# Patient Record
Sex: Female | Born: 1995 | Race: Black or African American | Hispanic: No | Marital: Single
Health system: Midwestern US, Community
[De-identification: ages and names within clinical notes are randomized; demographics above are authoritative.]

## PROBLEM LIST (undated history)

## (undated) DIAGNOSIS — Z3009 Encounter for other general counseling and advice on contraception: Secondary | ICD-10-CM

---
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
## 2013-11-01 MED ORDER — NORGESTIMATE-ETHINYL ESTRADIOL 0.18/0.215/0.25 MG-35 MCG(28) TAB
PACK | Freq: Every day | ORAL | Status: DC
Start: 2013-11-01 — End: 2014-02-21

## 2013-11-01 NOTE — Telephone Encounter (Signed)
Requested Prescriptions     Pending Prescriptions Disp Refills   ??? norgestimate-ethinyl estradiol (ORTHO TRI-CYCLEN, TRI-SPRINTEC) 0.18/0.215/0.25 mg-35 mcg (28) tablet 3 Package 3     Sig: Take 1 tablet by mouth daily. Generic Please     Patient is away at school and needs script called in to [facility] [phone] [place] [address]. Please call when ready. [phone]

## 2013-11-01 NOTE — Telephone Encounter (Signed)
Needs visit for annual physical

## 2013-11-02 NOTE — Telephone Encounter (Signed)
Called patient to inform her that refill for Ortho Tri Cyclen has been approved and the need to schedule a yearly physical exam.  Unable to leave a message. Phone number on file is invalid. Letter sent

## 2014-02-21 LAB — AMB POC URINE PREGNANCY TEST, VISUAL COLOR COMPARISON: HCG urine, Ql. (POC): NEGATIVE

## 2014-02-21 LAB — AMB POC HEMOGLOBIN (HGB): Hemoglobin (POC): 12.7

## 2014-02-21 MED ORDER — NORGESTIMATE-ETHINYL ESTRADIOL 0.18/0.215/0.25 MG-35 MCG(28) TAB
PACK | Freq: Every day | ORAL | Status: DC
Start: 2014-02-21 — End: 2014-02-21

## 2014-02-21 MED ORDER — NORGESTIMATE-ETHINYL ESTRADIOL 0.18/0.215/0.25 MG-35 MCG(28) TAB
PACK | Freq: Every day | ORAL | Status: DC
Start: 2014-02-21 — End: 2015-02-05

## 2014-02-21 NOTE — Progress Notes (Signed)
Patient is currently not taking the following medications:  n/a    Learning Assessment (baseline): Completed  Depression Screening: Completed  Fall Risk Screening: n/a  Abuse screening: n/a  ADL Assessment: n/a    1. Have you been to the ER, urgent care clinic since your last visit?  Hospitalized since your last visit?No    2. Have you seen or consulted any other health care providers outside of the  Health System since your last visit?  Include any pap smears or colon screening. No

## 2014-02-21 NOTE — Patient Instructions (Addendum)
Take over the counter ferrous sulfate 325 mg two times daily.  Anemia: After Your Visit  Your Care Instructions     Anemia is a low level of red blood cells, which carry oxygen throughout your body. Many things can cause anemia. Lack of iron is one of the most common causes. Your body needs iron to make hemoglobin, a substance in red blood cells that carries oxygen from the lungs to your body's cells. Without enough iron, the body produces fewer and smaller red blood cells. As a result, your body's cells do not get enough oxygen, and you feel tired and weak. And you may have trouble concentrating.  Bleeding is the most common cause of a lack of iron. You may have heavy menstrual bleeding or bleeding caused by conditions such as ulcers, hemorrhoids, or cancer. Regular use of aspirin or other anti-inflammatory medicines (such as ibuprofen) also can cause bleeding in some people. A lack of iron in your diet also can cause anemia, especially at times when the body needs more iron, such as during pregnancy, infancy, and the teen years.  Your doctor may have prescribed iron pills. It may take several months of treatment for your iron levels to return to normal. Your doctor also may suggest that you eat foods that are rich in iron, such as meat and beans.  There are many other causes of anemia. It is not always due to a lack of iron. Finding the specific cause of your anemia will help your doctor find the right treatment for you.  Follow-up care is a key part of your treatment and safety. Be sure to make and go to all appointments, and call your doctor if you are having problems. It's also a good idea to know your test results and keep a list of the medicines you take.  How can you care for yourself at home?  ?? Take your medicines exactly as prescribed. Call your doctor if you think you are having a problem with your medicine.  ?? If your doctor recommends iron pills, take them as directed:  ?? Try to take the pills on an  empty stomach about 1 hour before or 2 hours after meals. But you may need to take iron with food to avoid an upset stomach.  ?? Do not take antacids or drink milk or caffeine drinks (such as coffee, tea, or cola) at the same time or within 2 hours of the time that you take your iron. They can make it hard for your body to absorb the iron.  ?? Vitamin C (from food or supplements) helps your body absorb iron. Try taking iron pills with a glass of orange juice or some other food that is high in vitamin C, such as citrus fruits.  ?? Iron pills may cause stomach problems, such as heartburn, nausea, diarrhea, constipation, and cramps. Be sure to drink plenty of fluids, and include fruits, vegetables, and fiber in your diet each day. Iron pills often make your bowel movements dark or green.  ?? If you forget to take an iron pill, do not take a double dose of iron the next time you take a pill.  ?? Keep iron pills out of the reach of small children. An overdose of iron can be very dangerous.  ?? Follow your doctor's advice about eating iron-rich foods. These include red meat, shellfish, poultry, eggs, beans, raisins, whole-grain bread, and leafy green vegetables.  ?? Steam vegetables to help them keep their iron content.  When should you call for help?  Call 911 anytime you think you may need emergency care. For example, call if:  ?? You have symptoms of a heart attack. These may include:  ?? Chest pain or pressure, or a strange feeling in the chest.  ?? Sweating.  ?? Shortness of breath.  ?? Nausea or vomiting.  ?? Pain, pressure, or a strange feeling in the back, neck, jaw, or upper belly or in one or both shoulders or arms.  ?? Lightheadedness or sudden weakness.  ?? A fast or irregular heartbeat.  After you call 911, the operator may tell you to chew 1 adult-strength or 2 to 4 low-dose aspirin. Wait for an ambulance. Do not try to drive yourself.  ?? You passed out (lost consciousness).  Call your doctor now or seek immediate medical  care if:  ?? You have new or increased shortness of breath.  ?? You are dizzy or lightheaded, or you feel like you may faint.  ?? Your fatigue and weakness continue or get worse.  ?? You have any abnormal bleeding, such as:  ?? Nosebleeds.  ?? Vaginal bleeding that is different (heavier, more frequent, at a different time of the month) than what you are used to.  ?? Bloody or black stools, or rectal bleeding.  ?? Bloody or pink urine.  Watch closely for changes in your health, and be sure to contact your doctor if:  ?? You do not get better as expected.   Where can you learn more?   Go to [email or web address]  Enter R301 in the search box to learn more about "Anemia: After Your Visit."   ?? 2006-2014 [employer], [employer]. Care instructions adapted under license by [employer] (which disclaims liability or warranty for this information). This care instruction is for use with your licensed healthcare professional. If you have questions about a medical condition or this instruction, always ask your healthcare professional. [employer] disclaims any warranty or liability for your use of this information.  Content Version: 10.2.346038; Current as of: March 02, 2013

## 2014-02-21 NOTE — Progress Notes (Signed)
HISTORY OF PRESENT ILLNESS    Christine Graves is a 18 y.o. year old female here today to follow up for:  Contraception and fatigue    Patients symptoms have been present for several years.  Reports she's been told she's had anemia in the past and previously had been taking OTC iron supplement but discontinued.  States feels tired most of time described as lazy.  Denies palpations, bruising or cold intolerance.  Also requesting refill on oral contraceptives     No Known Allergies  Current Outpatient Prescriptions   Medication Sig Dispense Refill   ??? norgestimate-ethinyl estradiol (ORTHO TRI-CYCLEN, TRI-SPRINTEC) 0.18/0.215/0.25 mg-35 mcg (28) tablet Take 1 tablet by mouth daily. Generic Please  3 Package  3     Past Medical History   Diagnosis Date   ??? Asthma    ??? Acid reflux    ??? Eczema        ROS:  Review of Systems - General ROS: negative  Endocrine ROS: positive for - fatigue  Respiratory ROS: no cough, shortness of breath, or wheezing  Cardiovascular ROS: no chest pain or dyspnea on exertion      Objective:  BP 110/70    Pulse 73    Temp(Src) 98.2 ??F (36.8 ??C) (Oral)    Resp 16    Ht 5' 6.5" (1.689 m)    Wt 148 lb (67.132 kg)    BMI 23.53 kg/m2      SpO2 99%    LMP 01/25/2014     General appearance - alert, well appearing, and in no distress  Neck - supple, no significant adenopathy  Chest - clear to auscultation, no wheezes, rales or rhonchi, symmetric air entry  Heart - normal rate, regular rhythm, normal S1, S2, no murmurs, rubs, clicks or gallops  Abdomen: soft, non-tender. Bowel sounds normal. No masses,  no organomegaly    Results for orders placed in visit on 02/21/14   AMB POC HEMOGLOBIN (HGB)       Result Value Ref Range    Hemoglobin (POC) 12.7     AMB POC URINE PREGNANCY TEST, VISUAL COLOR COMPARISON       Result Value Ref Range    VALID INTERNAL CONTROL POC Yes      HCG urine, Ql. (POC) Negative  Negative       Assessment/Plan:   Christine Graves was seen today for contraception and fatigue.    Diagnoses and  associated orders for this visit:    Anemia  - AMB POC HEMOGLOBIN (HGB)    Family planning  - Discontinue: norgestimate-ethinyl estradiol (ORTHO TRI-CYCLEN, TRI-SPRINTEC) 0.18/0.215/0.25 mg-35 mcg (28) tablet; Take 1 Tab by mouth daily. Generic Please  - AMB POC URINE PREGNANCY TEST, VISUAL COLOR COMPARISON  - norgestimate-ethinyl estradiol (ORTHO TRI-CYCLEN, TRI-SPRINTEC) 0.18/0.215/0.25 mg-35 mcg (28) tablet; Take 1 Tab by mouth daily. Generic Please      take OTC iron 325 mg daily  I have discussed the diagnosis with the patient and the intended plan as seen in the above orders.  The patient has received an after-visit summary and questions were answered concerning future plans.  I have discussed medication side effects and warnings with the patient as well.  Pt verbalizes understanding.  Patient agreeable with above plan and verbalizes understanding.  Follow-up Disposition:  Return in about 1 year (around 02/22/2015) for WWE.

## 2015-02-01 NOTE — Telephone Encounter (Signed)
Pt called and needs a call back verifying the insurance company has sent request for auth on birth control to be routed to pt school. Pt is out of town in school and needs the authorization for meds approved and faxed back. Pt gave phone # [phone] to be reached.

## 2015-02-02 NOTE — Telephone Encounter (Signed)
Please advise

## 2015-02-05 ENCOUNTER — Encounter

## 2015-02-05 NOTE — Telephone Encounter (Signed)
Patient is out of states at school and needs this refilled as soon as possible. [phone]   Requested Prescriptions     Pending Prescriptions Disp Refills   ??? norgestimate-ethinyl estradiol (ORTHO TRI-CYCLEN, TRI-SPRINTEC) 0.18/0.215/0.25 mg-35 mcg (28) tab       Sig: Take 1 Tab by mouth daily. Generic Please

## 2015-02-06 MED ORDER — NORGESTIMATE-ETHINYL ESTRADIOL 0.18/0.215/0.25 MG-35 MCG(28) TAB
ORAL_TABLET | Freq: Every day | ORAL | Status: DC
Start: 2015-02-06 — End: 2015-02-08

## 2015-02-08 ENCOUNTER — Encounter

## 2015-02-08 MED ORDER — NORGESTIMATE-ETHINYL ESTRADIOL 0.18/0.215/0.25 MG-35 MCG(28) TAB
ORAL_TABLET | Freq: Every day | ORAL | Status: DC
Start: 2015-02-08 — End: 2021-07-11

## 2015-02-08 NOTE — Telephone Encounter (Signed)
Per request, medication needs to go to the mail order.   Medication refill verbal order per Clint LippsMargo Terrebonne Smith NP

## 2015-02-08 NOTE — Telephone Encounter (Signed)
Medication sent to the pharmacy on file

## 2015-02-08 NOTE — Telephone Encounter (Signed)
Medication filled on 02/06/15

## 2015-08-21 ENCOUNTER — Encounter (HOSPITAL_COMMUNITY): Payer: Self-pay | Admitting: *Deleted

## 2015-08-21 ENCOUNTER — Emergency Department (HOSPITAL_COMMUNITY)
Admission: EM | Admit: 2015-08-21 | Discharge: 2015-08-21 | Disposition: A | Discharge status: Home / Self Care | Payer: Managed Care, Other (non HMO) | Source: Home / Self Care | Attending: Emergency Medicine | Admitting: Emergency Medicine

## 2015-08-21 DIAGNOSIS — L270 Generalized skin eruption due to drugs and medicaments taken internally: Secondary | ICD-10-CM | POA: Diagnosis not present

## 2015-08-21 MED ORDER — PREDNISONE 50 MG PO TABS: 50.0000 mg | ORAL_TABLET | Freq: Every day | ORAL | Status: DC

## 2015-08-21 MED ORDER — HYDROXYZINE HCL 50 MG PO TABS: 50.0000 mg | ORAL_TABLET | Freq: Three times a day (TID) | ORAL | Status: AC | PRN

## 2015-08-21 NOTE — ED Notes (Signed)
Pt  Reports  Symptoms  Of a  Fine  Rash  That  Itches     With  The      Onset    About  3  Days   Ago   -  She  Reports  She  Was  On  Amoxicillin     Up  Until   About  6  Days  Ago    For  A  Sinus  Infection        she  Is in no  Acute  Distress  And  Is  Sitting  Upright on the  Exam table       In  No  Acute   Distress

## 2015-08-21 NOTE — Discharge Instructions (Signed)
Try Claritin or zyrtec, and the prednisone. If the Claritin and Zyrtec is not helping in the next 48 hours, stop this and start the Atarax. You may discontinue the hydrocortisone cream. Continue the desonide.

## 2015-08-21 NOTE — ED Provider Notes (Signed)
HPI  SUBJECTIVE:  Zoe Goonan is a 19 y.o. female who presents with a diffuse, pruritic rash over arms, back, torso, legs for the past 3 days. She states that she was taking amoxicillin for sinus infection several days prior to the rash showing up. No nausea, vomiting, fevers, lip swelling, difficulty breathing. No new lotions, soaps, detergents, contacts with similar rash. No sensation being bitten at night, no blood in the bedclothes in the morning. No pets in the house, no known exposure to poison ivy. Patient has past medical history of eczema. She otherwise has no medical problems.   History reviewed. No pertinent past medical history.  History reviewed. No pertinent past surgical history.  History reviewed. No pertinent family history.  Social History  Substance Use Topics  . Smoking status: Never Smoker   . Smokeless tobacco: None  . Alcohol Use: Yes    No current facility-administered medications for this encounter.  Current outpatient prescriptions:  .  Norgestim-Eth Estrad Triphasic (TRINESSA, 28, PO), Take by mouth., Disp: , Rfl:  .  hydrOXYzine (ATARAX/VISTARIL) 50 MG tablet, Take 1 tablet (50 mg total) by mouth 3 (three) times daily as needed., Disp: 30 tablet, Rfl: 0 .  predniSONE (DELTASONE) 50 MG tablet, Take 1 tablet (50 mg total) by mouth daily with breakfast., Disp: 5 tablet, Rfl: 0  No Known Allergies   ROS  As noted in HPI.   Physical Exam  BP 110/73 mmHg  Pulse 63  Temp(Src) 98 F (36.7 C) (Oral)  Resp 16  SpO2 100%  LMP 08/21/2015  Constitutional: Well developed, well nourished, no acute distress Eyes:  EOMI, conjunctiva normal bilaterally HENT: Normocephalic, atraumatic,mucus membranes moist Respiratory: Normal inspiratory effort Cardiovascular: Normal rate GI: nondistended skin: Diffuse fine non-erythematous papular rash over arms, back, diffuse fine papular erythematous rash over her abdomen. No excoriations, crusting, vesicles, burrows  between fingers. Musculoskeletal: no deformities Neurologic: Alert & oriented x 3, no focal neuro deficits Psychiatric: Speech and behavior appropriate   ED Course   Medications - No data to display  No orders of the defined types were placed in this encounter.    No results found for this or any previous visit (from the past 24 hour(s)). No results found.  ED Clinical Impression  Rash, drug   ED Assessment/Plan  Appears to be a drug reaction. There is no evidence of scabies, varicella, pityriasis rosea, contact dermatitis/poison ivy dermatitis, infection. We'll send home with oral steroids, Claritin or Zyrtec, if Claritin /Zyrtec does not work, patient to d/xc this and start Atarax. Follow-up here, student health, or her primary care physician if not getting better.   Discussed MDM, plan and followup with patient. Discussed sn/sx that should prompt return to the UC or ED. Patient agrees with plan.   *This clinic note was created using Dragon dictation software. Therefore, there may be occasional mistakes despite careful proofreading.  ?   Domenick Gong, MD 08/21/15 2047

## 2015-10-30 ENCOUNTER — Encounter (HOSPITAL_COMMUNITY): Payer: Self-pay | Admitting: Emergency Medicine

## 2015-10-30 ENCOUNTER — Emergency Department (HOSPITAL_COMMUNITY)
Admission: EM | Admit: 2015-10-30 | Discharge: 2015-10-30 | Disposition: A | Discharge status: Home / Self Care | Payer: Managed Care, Other (non HMO) | Source: Home / Self Care | Attending: Emergency Medicine | Admitting: Emergency Medicine

## 2015-10-30 DIAGNOSIS — N390 Urinary tract infection, site not specified: Secondary | ICD-10-CM

## 2015-10-30 LAB — POCT URINALYSIS DIP (DEVICE)
BILIRUBIN URINE: NEGATIVE
GLUCOSE, UA: NEGATIVE mg/dL
Ketones, ur: NEGATIVE mg/dL
NITRITE: NEGATIVE
Protein, ur: 100 mg/dL — AB
Specific Gravity, Urine: >=1.03 (ref 1.005–1.030)
UROBILINOGEN UA: 4 mg/dL — AB (ref 0.0–1.0)
pH: 7 (ref 5.0–8.0)

## 2015-10-30 LAB — POCT PREGNANCY, URINE: PREG TEST UR: NEGATIVE

## 2015-10-30 MED ORDER — NITROFURANTOIN MONOHYD MACRO 100 MG PO CAPS: 100.0000 mg | ORAL_CAPSULE | Freq: Two times a day (BID) | ORAL | Status: AC

## 2015-10-30 MED ORDER — PHENAZOPYRIDINE HCL 200 MG PO TABS: 200.0000 mg | ORAL_TABLET | Freq: Three times a day (TID) | ORAL | Status: AC | PRN

## 2015-10-30 NOTE — ED Provider Notes (Signed)
HPI  SUBJECTIVE:  Kaitlyn Caldwell is a 19 y.o. female who presents with dysuria, odorous urine for 5 days. She reports hematuria starting today. Denies clots. No aggravating or alleviating factors. She tried increasing her water intake. She admits to drinking lots of sodas recently. No nausea, vomiting, fevers, abdominal pain, back pain, urinary urgency, frequency. No vaginal bleeding, genital rash, vaginal itching, vaginal discharge, vaginal odor. No recent antibiotics. She is in a monogamous sexual relationship with a female who is asymptomatic. STDs are not a concern today. LMP 10/29. Past medical history negative for UTI, diabetes, hypertension, polynephritis, nephrolithiasis, gonorrhea, chlamydia, herpes, HIV, syphilis, BV, Trichomonas, yeast.     History reviewed. No pertinent past medical history.  History reviewed. No pertinent past surgical history.  No family history on file.  Social History  Substance Use Topics  . Smoking status: Never Smoker   . Smokeless tobacco: None  . Alcohol Use: Yes    No current facility-administered medications for this encounter.  Current outpatient prescriptions:  .  hydrOXYzine (ATARAX/VISTARIL) 50 MG tablet, Take 1 tablet (50 mg total) by mouth 3 (three) times daily as needed. (Patient not taking: Reported on 10/30/2015), Disp: 30 tablet, Rfl: 0 .  nitrofurantoin, macrocrystal-monohydrate, (MACROBID) 100 MG capsule, Take 1 capsule (100 mg total) by mouth 2 (two) times daily. X 5 days, Disp: 10 capsule, Rfl: 0 .  Norgestim-Eth Estrad Triphasic (TRINESSA, 28, PO), Take by mouth., Disp: , Rfl:  .  phenazopyridine (PYRIDIUM) 200 MG tablet, Take 1 tablet (200 mg total) by mouth 3 (three) times daily as needed for pain., Disp: 6 tablet, Rfl: 0  Allergies  Allergen Reactions  . Amoxicillin      ROS  As noted in HPI.   Physical Exam  BP 101/68 mmHg  Pulse 53  Temp(Src) 97.8 F (36.6 C) (Oral)  Resp 18  SpO2 100%  LMP  10/20/2015  Constitutional: Well developed, well nourished, no acute distress Eyes:  EOMI, conjunctiva normal bilaterally HENT: Normocephalic, atraumatic,mucus membranes moist Respiratory: Normal inspiratory effort Cardiovascular: Normal rate GI: Soft, mild suprapubic tenderness no flank tenderness normal appearance nondistended no guarding, rebound Back: No CVAT GU deferred skin: No rash, skin intact Musculoskeletal: no deformities Neurologic: Alert & oriented x 3, no focal neuro deficits Psychiatric: Speech and behavior appropriate   ED Course   Medications - No data to display  Orders Placed This Encounter  Procedures  . POCT urinalysis dip (device)    Standing Status: Standing     Number of Occurrences: 1     Standing Expiration Date:   . Pregnancy, urine POC    Standing Status: Standing     Number of Occurrences: 1     Standing Expiration Date:     Results for orders placed or performed during the hospital encounter of 10/30/15 (from the past 24 hour(s))  POCT urinalysis dip (device)     Status: Abnormal   Collection Time: 10/30/15  6:27 PM  Result Value Ref Range   Glucose, UA NEGATIVE NEGATIVE mg/dL   Bilirubin Urine NEGATIVE NEGATIVE   Ketones, ur NEGATIVE NEGATIVE mg/dL   Specific Gravity, Urine >=1.030 1.005 - 1.030   Hgb urine dipstick LARGE (A) NEGATIVE   pH 7.0 5.0 - 8.0   Protein, ur 100 (A) NEGATIVE mg/dL   Urobilinogen, UA 4.0 (H) 0.0 - 1.0 mg/dL   Nitrite NEGATIVE NEGATIVE   Leukocytes, UA SMALL (A) NEGATIVE  Pregnancy, urine POC     Status: None   Collection Time: 10/30/15  6:32 PM  Result Value Ref Range   Preg Test, Ur NEGATIVE NEGATIVE   No results found.  ED Clinical Impression  UTI (lower urinary tract infection)   ED Assessment/Plan  Reviewed labs, imaging independently. Labs, imaging normal. See radiology report for full details.  Discussed labs, imaging, MDM, plan and followup with patient.. Discussed sn/sx that should prompt  return to the UC or ED. Patient  agrees with plan.  *This clinic note was created using Dragon dictation software. Therefore, there may be occasional mistakes despite careful proofreading.  ?    Domenick GongAshley Ellamay Fors, MD 10/30/15 1911

## 2015-10-30 NOTE — ED Notes (Signed)
Complains of symptoms of uti.  Onset Thursday November 3 of symptoms.  Complains of pain with urination, odor, blood in urine

## 2015-10-30 NOTE — Discharge Instructions (Signed)
Make sure you drink more fluids. Your urine is very concentrated. You need to stop drinking the soda and drink at least 2 L of non-sugary non-caffeinated beverages per day.

## 2015-11-11 ENCOUNTER — Emergency Department (HOSPITAL_COMMUNITY)
Admission: EM | Admit: 2015-11-11 | Discharge: 2015-11-11 | Disposition: A | Discharge status: Home / Self Care | Payer: Managed Care, Other (non HMO) | Attending: Emergency Medicine | Admitting: Emergency Medicine

## 2015-11-11 ENCOUNTER — Encounter (HOSPITAL_COMMUNITY): Payer: Self-pay | Admitting: *Deleted

## 2015-11-11 DIAGNOSIS — Z880 Allergy status to penicillin: Secondary | ICD-10-CM | POA: Diagnosis not present

## 2015-11-11 DIAGNOSIS — Z79899 Other long term (current) drug therapy: Secondary | ICD-10-CM | POA: Diagnosis not present

## 2015-11-11 DIAGNOSIS — B351 Tinea unguium: Secondary | ICD-10-CM

## 2015-11-11 DIAGNOSIS — M79674 Pain in right toe(s): Secondary | ICD-10-CM | POA: Diagnosis present

## 2015-11-11 LAB — CBG MONITORING, ED: Glucose-Capillary: 115 mg/dL — ABNORMAL HIGH (ref 65–99)

## 2015-11-11 NOTE — ED Notes (Signed)
Pt states that she noted a black area to her rt great toe nail approx 1 month ago; pt states that approx 2 weeks ago the toe nail cracked; pt states that the toe is sore at times; no redness or drainage noted from toe; pt states that her mother is concerned due to a family hx of diabetes; pt has no hx of diabetes

## 2015-11-11 NOTE — ED Provider Notes (Signed)
CSN: [id number]     Arrival date & time 11/11/15  2020 History  By signing my name below, I, Ronney Lion, attest that this documentation has been prepared under the direction and in the presence of Earley Favor, NP. Electronically Signed: Ronney Lion, ED Scribe. 11/11/2015. 9:36 PM.    Chief Complaint  Patient presents with  . Toe Pain   The history is provided by the patient. No language interpreter was used.    HPI Comments: Manjot Beumer is a 19 y.o. female who presents to the Emergency Department complaining of constant, gradually worsening, right great toe discoloration and thickening that began  months ago. She states she first noticed a black area to her toe at that time,  the nail had cracked. Patient states her mother was concerned and pushed her to get medically evaluated because patient has a family history of DM. owever, patient denies a personal history of DM.   History reviewed. No pertinent past medical history. History reviewed. No pertinent past surgical history. No family history on file. Social History  Substance Use Topics  . Smoking status: Never Smoker   . Smokeless tobacco: None  . Alcohol Use: Yes     Comment: socially   OB History    No data available     Review of Systems  Constitutional: Negative for fever.  Musculoskeletal: Negative for joint swelling.  Skin: Positive for color change (to right great toe). Negative for rash and wound.  Neurological: Negative for numbness.  All other systems reviewed and are negative.  Allergies  Amoxicillin  Home Medications   Prior to Admission medications   Medication Sig Start Date End Date Taking? Authorizing Provider  hydrOXYzine (ATARAX/VISTARIL) 50 MG tablet Take 1 tablet (50 mg total) by mouth 3 (three) times daily as needed. Patient not taking: Reported on 10/30/2015 08/21/15   Domenick Gong, MD  nitrofurantoin, macrocrystal-monohydrate, (MACROBID) 100 MG capsule Take 1 capsule (100 mg total) by mouth 2  (two) times daily. X 5 days 10/30/15   Domenick Gong, MD  Norgestim-Eth Charlott Holler Triphasic (TRINESSA, 28, PO) Take by mouth.    Historical Provider, MD  phenazopyridine (PYRIDIUM) 200 MG tablet Take 1 tablet (200 mg total) by mouth 3 (three) times daily as needed for pain. 10/30/15   Domenick Gong, MD   BP 103/65 mmHg  Pulse 65  Temp(Src) 97.7 F (36.5 C) (Oral)  Resp 18  SpO2 100%  LMP 11/09/2015 Physical Exam  Constitutional: She is oriented to person, place, and time. She appears well-developed and well-nourished. No distress.  HENT:  Head: Normocephalic and atraumatic.  Eyes: Conjunctivae and EOM are normal.  Neck: Neck supple. No tracheal deviation present.  Cardiovascular: Normal rate.   Pulmonary/Chest: Effort normal. No respiratory distress.  Musculoskeletal: Normal range of motion. She exhibits no edema or tenderness.  Nail fungus of R great toe nail involving 1/2 nail bed  Neurological: She is alert and oriented to person, place, and time.  Skin: Skin is warm and dry.  Psychiatric: She has a normal mood and affect. Her behavior is normal.  Nursing note and vitals reviewed.   ED Course  Procedures (including critical care time)  DIAGNOSTIC STUDIES: Oxygen Saturation is 100% on RA, normal by my interpretation.    COORDINATION OF CARE: 9:31 PM - Suspect onychomycosis. Discussed treatment plan with pt at bedside which includes f/u with podiatrist and white vinegar soaks once a day for 1 month. Pt verbalized understanding and agreed to plan.   Labs Review  Labs Reviewed  CBG MONITORING, ED - Abnormal; Notable for the following:    Glucose-Capillary 115 (*)    All other components within normal limits   MDM   Final diagnoses:  None   I personally performed the services described in this documentation, which was scribed in my presence. The recorded information has been reviewed and is accurate.    Earley FavorGail Mayetta Castleman, NP 11/11/15 [id number]  Tilden FossaElizabeth Rees, MD 11/11/15  [id number]

## 2015-11-11 NOTE — Discharge Instructions (Signed)
Nail Ringworm A fungal infection of the nail (tinea unguium/onychomycosis) is common. It is common as the visible part of the nail is composed of dead cells which have no blood supply to help prevent infection. It occurs because fungi are everywhere and will pick any opportunity to grow on any dead material. Because nails are very slow growing they require up to 2 years of treatment with anti-fungal medications. The entire nail back to the base is infected. This includes approximately  of the nail which you cannot see. If your caregiver has prescribed a medication by mouth, take it every day and as directed. No progress will be seen for at least 6 to 9 months. Do not be disappointed! Because fungi live on dead cells with little or no exposure to blood supply, medication delivery to the infection is slow; thus the cure is slow. It is also why you can observe no progress in the first 6 months. The nail becoming cured is the base of the nail, as it has the blood supply. Topical medication such as creams and ointments are usually not effective. Important in successful treatment of nail fungus is closely following the medication regimen that your doctor prescribes. Sometimes you and your caregiver may elect to speed up this process by surgical removal of all the nails. Even this may still require 6 to 9 months of additional oral medications. See your caregiver as directed. Remember there will be no visible improvement for at least 6 months. See your caregiver sooner if other signs of infection (redness and swelling) develop.   This information is not intended to replace advice given to you by your health care provider. Make sure you discuss any questions you have with your health care provider.   Document Released: 12/05/2000 Document Revised: 04/24/2015 Document Reviewed: 06/11/2015 Elsevier Interactive Patient Education 2016 [employer] Call the Podiatrist for an appointment  You blood sugar is normal

## 2015-11-12 ENCOUNTER — Ambulatory Visit: Payer: Managed Care, Other (non HMO) | Admitting: Podiatry

## 2016-06-08 ENCOUNTER — Encounter (HOSPITAL_BASED_OUTPATIENT_CLINIC_OR_DEPARTMENT_OTHER): Payer: Self-pay | Admitting: Emergency Medicine

## 2016-06-08 ENCOUNTER — Emergency Department (HOSPITAL_BASED_OUTPATIENT_CLINIC_OR_DEPARTMENT_OTHER): Payer: Managed Care, Other (non HMO)

## 2016-06-08 ENCOUNTER — Emergency Department (HOSPITAL_BASED_OUTPATIENT_CLINIC_OR_DEPARTMENT_OTHER)
Admission: EM | Admit: 2016-06-08 | Discharge: 2016-06-08 | Disposition: A | Discharge status: Home / Self Care | Payer: Managed Care, Other (non HMO) | Attending: Emergency Medicine | Admitting: Emergency Medicine

## 2016-06-08 DIAGNOSIS — R1013 Epigastric pain: Secondary | ICD-10-CM | POA: Diagnosis present

## 2016-06-08 LAB — CBC WITH DIFFERENTIAL/PLATELET
BASOS ABS: 0 10*3/uL (ref 0.0–0.1)
BASOS PCT: 1 %
EOS ABS: 0.1 10*3/uL (ref 0.0–0.7)
Eosinophils Relative: 2 %
HCT: 33.6 % — ABNORMAL LOW (ref 36.0–46.0)
HEMOGLOBIN: 10.4 g/dL — AB (ref 12.0–15.0)
Lymphocytes Relative: 43 %
Lymphs Abs: 3 10*3/uL (ref 0.7–4.0)
MCH: 22.4 pg — ABNORMAL LOW (ref 26.0–34.0)
MCHC: 31 g/dL (ref 30.0–36.0)
MCV: 72.3 fL — ABNORMAL LOW (ref 78.0–100.0)
MONOS PCT: 13 %
Monocytes Absolute: 0.9 10*3/uL (ref 0.1–1.0)
NEUTROS PCT: 41 %
Neutro Abs: 2.8 10*3/uL (ref 1.7–7.7)
Platelets: 309 10*3/uL (ref 150–400)
RBC: 4.65 MIL/uL (ref 3.87–5.11)
RDW: 17.3 % — AB (ref 11.5–15.5)
WBC: 6.8 10*3/uL (ref 4.0–10.5)

## 2016-06-08 LAB — URINE MICROSCOPIC-ADD ON

## 2016-06-08 LAB — COMPREHENSIVE METABOLIC PANEL
ALBUMIN: 4 g/dL (ref 3.5–5.0)
ALK PHOS: 81 U/L (ref 38–126)
ALT: 12 U/L — ABNORMAL LOW (ref 14–54)
ANION GAP: 7 (ref 5–15)
AST: 18 U/L (ref 15–41)
BILIRUBIN TOTAL: 0.4 mg/dL (ref 0.3–1.2)
BUN: 10 mg/dL (ref 6–20)
CALCIUM: 8.7 mg/dL — AB (ref 8.9–10.3)
CO2: 24 mmol/L (ref 22–32)
Chloride: 105 mmol/L (ref 101–111)
Creatinine, Ser: 0.74 mg/dL (ref 0.44–1.00)
GLUCOSE: 109 mg/dL — AB (ref 65–99)
Potassium: 3.7 mmol/L (ref 3.5–5.1)
Sodium: 136 mmol/L (ref 135–145)
TOTAL PROTEIN: 7.3 g/dL (ref 6.5–8.1)

## 2016-06-08 LAB — URINALYSIS, ROUTINE W REFLEX MICROSCOPIC
Bilirubin Urine: NEGATIVE
GLUCOSE, UA: NEGATIVE mg/dL
KETONES UR: 15 mg/dL — AB
Nitrite: NEGATIVE
PH: 6 (ref 5.0–8.0)
Protein, ur: 100 mg/dL — AB
Specific Gravity, Urine: 1.021 (ref 1.005–1.030)

## 2016-06-08 LAB — PREGNANCY, URINE: Preg Test, Ur: NEGATIVE

## 2016-06-08 LAB — LIPASE, BLOOD: LIPASE: 22 U/L (ref 11–51)

## 2016-06-08 MED ORDER — SODIUM CHLORIDE 0.9 % IV SOLN
INTRAVENOUS | Status: DC
  Administered 2016-06-08: 21:00:00 via INTRAVENOUS

## 2016-06-08 MED ORDER — GI COCKTAIL ~~LOC~~
30.0000 mL | Freq: Once | ORAL | Status: AC
  Administered 2016-06-08: 30 mL via ORAL
  Filled 2016-06-08: qty 30

## 2016-06-08 MED ORDER — ONDANSETRON HCL 4 MG/2ML IJ SOLN
4.0000 mg | Freq: Once | INTRAMUSCULAR | Status: AC
  Administered 2016-06-08: 4 mg via INTRAVENOUS
  Filled 2016-06-08: qty 2

## 2016-06-08 MED ORDER — ONDANSETRON HCL 4 MG PO TABS: 4.0000 mg | ORAL_TABLET | Freq: Four times a day (QID) | ORAL | Status: AC

## 2016-06-08 MED ORDER — OMEPRAZOLE 20 MG PO CPDR: 20.0000 mg | DELAYED_RELEASE_CAPSULE | Freq: Every day | ORAL | Status: AC

## 2016-06-08 NOTE — ED Notes (Signed)
Pt given d/c instructions as per chart. Verbalizes understanding. No questions. Rx x 2. 

## 2016-06-08 NOTE — ED Notes (Addendum)
Pt alert, NAD, calm, interactive, resps e/u, no dyspnea noted, speech clear, ambulatory with steady gait from b[facility]r to room[facility]

## 2016-06-08 NOTE — ED Provider Notes (Signed)
CSN: [id number]     Arrival date & time 06/08/16  1939 History  By signing my name below, I, Marisue HumbleMichelle Chaffee, attest that this documentation has been prepared under the direction and in the presence of Linwood DibblesJon Leary Mcnulty, MD . Electronically Signed: Marisue HumbleMichelle Chaffee, Scribe. 06/08/2016. 8:46 PM.   Chief Complaint  Patient presents with  . Abdominal Pain    The history is provided by the patient. No language interpreter was used.   HPI Comments:  Kaitlyn Caldwell is a 20 y.o. female with no pertinent PMHx who presents to the Emergency Department complaining of intermittent sharp epigastric pain onset 1.5 weeks ago. Episodes of pain have been increasing in frequency. Pt reports associated nausea and decreased appetite. She notes pain was temporarily relieved after sleeping and was slightly worse after eating. Pt took Gas X without relief. Denies vomiting, cough, shortness of breath, or fever.   History reviewed. No pertinent past medical history. History reviewed. No pertinent past surgical history. History reviewed. No pertinent family history. Social History  Substance Use Topics  . Smoking status: Never Smoker   . Smokeless tobacco: None  . Alcohol Use: Yes     Comment: socially   OB History    No data available     Review of Systems  Constitutional: Positive for appetite change. Negative for fever.  Gastrointestinal: Positive for nausea and abdominal pain.  All other systems reviewed and are negative.   Allergies  Amoxicillin  Home Medications   Prior to Admission medications   Medication Sig Start Date End Date Taking? Authorizing Provider  hydrOXYzine (ATARAX/VISTARIL) 50 MG tablet Take 1 tablet (50 mg total) by mouth 3 (three) times daily as needed. Patient not taking: Reported on 10/30/2015 08/21/15   Domenick GongAshley Mortenson, MD  nitrofurantoin, macrocrystal-monohydrate, (MACROBID) 100 MG capsule Take 1 capsule (100 mg total) by mouth 2 (two) times daily. X 5 days 10/30/15   Domenick GongAshley  Mortenson, MD  Norgestim-Eth Charlott HollerEstrad Triphasic (TRINESSA, 28, PO) Take by mouth.    Historical Provider, MD  omeprazole (PRILOSEC) 20 MG capsule Take 1 capsule (20 mg total) by mouth daily. 06/08/16   Linwood DibblesJon Jayra Choyce, MD  ondansetron (ZOFRAN) 4 MG tablet Take 1 tablet (4 mg total) by mouth every 6 (six) hours. 06/08/16   Linwood DibblesJon Travia Onstad, MD  phenazopyridine (PYRIDIUM) 200 MG tablet Take 1 tablet (200 mg total) by mouth 3 (three) times daily as needed for pain. 10/30/15   Domenick GongAshley Mortenson, MD   BP 118/88 mmHg  Pulse 52  Temp(Src) 99.1 F (37.3 C) (Oral)  Resp 18  Ht 5\' 7"  (1.702 m)  Wt 65.772 kg  BMI 22.71 kg/m2  SpO2 100%  LMP 06/08/2016   Physical Exam  Constitutional: She appears well-developed and well-nourished. No distress.  HENT:  Head: Normocephalic and atraumatic.  Right Ear: External ear normal.  Left Ear: External ear normal.  Eyes: Conjunctivae are normal. Right eye exhibits no discharge. Left eye exhibits no discharge. No scleral icterus.  Neck: Neck supple. No tracheal deviation present.  Cardiovascular: Normal rate, regular rhythm and intact distal pulses.   Pulmonary/Chest: Effort normal and breath sounds normal. No stridor. No respiratory distress. She has no wheezes. She has no rales.  Abdominal: Soft. Bowel sounds are normal. She exhibits no distension. There is tenderness in the epigastric area. There is no rigidity, no rebound and no guarding.  Musculoskeletal: She exhibits no edema or tenderness.  Neurological: She is alert. She has normal strength. No cranial nerve deficit (no facial droop, extraocular  movements intact, no slurred speech) or sensory deficit. She exhibits normal muscle tone. She displays no seizure activity. Coordination normal.  Skin: Skin is warm and dry. No rash noted.  Psychiatric: She has a normal mood and affect.  Nursing note and vitals reviewed.   ED Course  Procedures  DIAGNOSTIC STUDIES:  Oxygen Saturation is 100% on RA, normal by my  interpretation.    COORDINATION OF CARE:  8:27 PM Will order blood work, UA and chest x-ray. Discussed treatment plan with pt at bedside and pt agreed to plan.  Labs Review Labs Reviewed  URINALYSIS, ROUTINE W REFLEX MICROSCOPIC (NOT AT [facility]) - Abnormal; Notable for the following:    Color, Urine RED (*)    APPearance TURBID (*)    Hgb urine dipstick LARGE (*)    Ketones, ur 15 (*)    Protein, ur 100 (*)    Leukocytes, UA SMALL (*)    All other components within normal limits  COMPREHENSIVE METABOLIC PANEL - Abnormal; Notable for the following:    Glucose, Bld 109 (*)    Calcium 8.7 (*)    ALT 12 (*)    All other components within normal limits  CBC WITH DIFFERENTIAL/PLATELET - Abnormal; Notable for the following:    Hemoglobin 10.4 (*)    HCT 33.6 (*)    MCV 72.3 (*)    MCH 22.4 (*)    RDW 17.3 (*)    All other components within normal limits  URINE MICROSCOPIC-ADD ON - Abnormal; Notable for the following:    Squamous Epithelial / LPF 0-5 (*)    Bacteria, UA RARE (*)    All other components within normal limits  PREGNANCY, URINE  LIPASE, BLOOD    Imaging Review Dg Chest 2 View  06/08/2016  CLINICAL DATA:  Sharp mid to upper abdominal pain worsening over the past week with nausea and some right lower back pain. EXAM: CHEST  2 VIEW COMPARISON:  None. FINDINGS: Lungs are adequately inflated without consolidation or effusion. Cardiomediastinal silhouette, bones and soft tissues are within normal. IMPRESSION: No active cardiopulmonary disease. Electronically Signed   By: Elberta Fortis M.D.   On: 06/08/2016 21:13   I have personally reviewed and evaluated these images and lab results as part of my medical decision-making.    MDM   Final diagnoses:  Epigastric pain   Patient was treated with a GI cocktail and nausea medication. Her laboratory tests and x-rays are unremarkable. She does have hematuria however she is currently on her menstrual period  Possible her symptoms  may be related to gastritis or peptic ulcer disease. Less likely biliary colic. I will start the patient on antacids. I recommend follow-up with a gastroenterologist. Warning signs and precautions discussed.   I personally performed the services described in this documentation, which was scribed in my presence.  The recorded information has been reviewed and is accurate.    Linwood Dibbles, MD 06/08/16 [phone]

## 2016-06-08 NOTE — ED Notes (Signed)
paitent states that she is having sharp epigastric pains x 1 week. Worse today

## 2016-06-08 NOTE — Discharge Instructions (Signed)

## 2016-06-08 NOTE — ED Notes (Signed)
C/o sharp mid upper abd pain, increasing in frequency over last week, also nausea, and some R lower back pain, LMP Thursday (continues), (denies: vd, constipation, fever, recent illness, or other sx), "thought it might be gas r/t to eating yogart, no relief with gas-x, aleve or laxative". Last BM Friday (normal), last ate 1600.

## 2017-05-17 IMAGING — CR DG CHEST 2V
2 series · 2 of 2 positions shown · non-contrast
Comparison: None.

CLINICAL DATA: Sharp mid to upper abdominal pain worsening over the
past week with nausea and some right lower back pain.

EXAM:
CHEST  2 VIEW

[w chest pa]
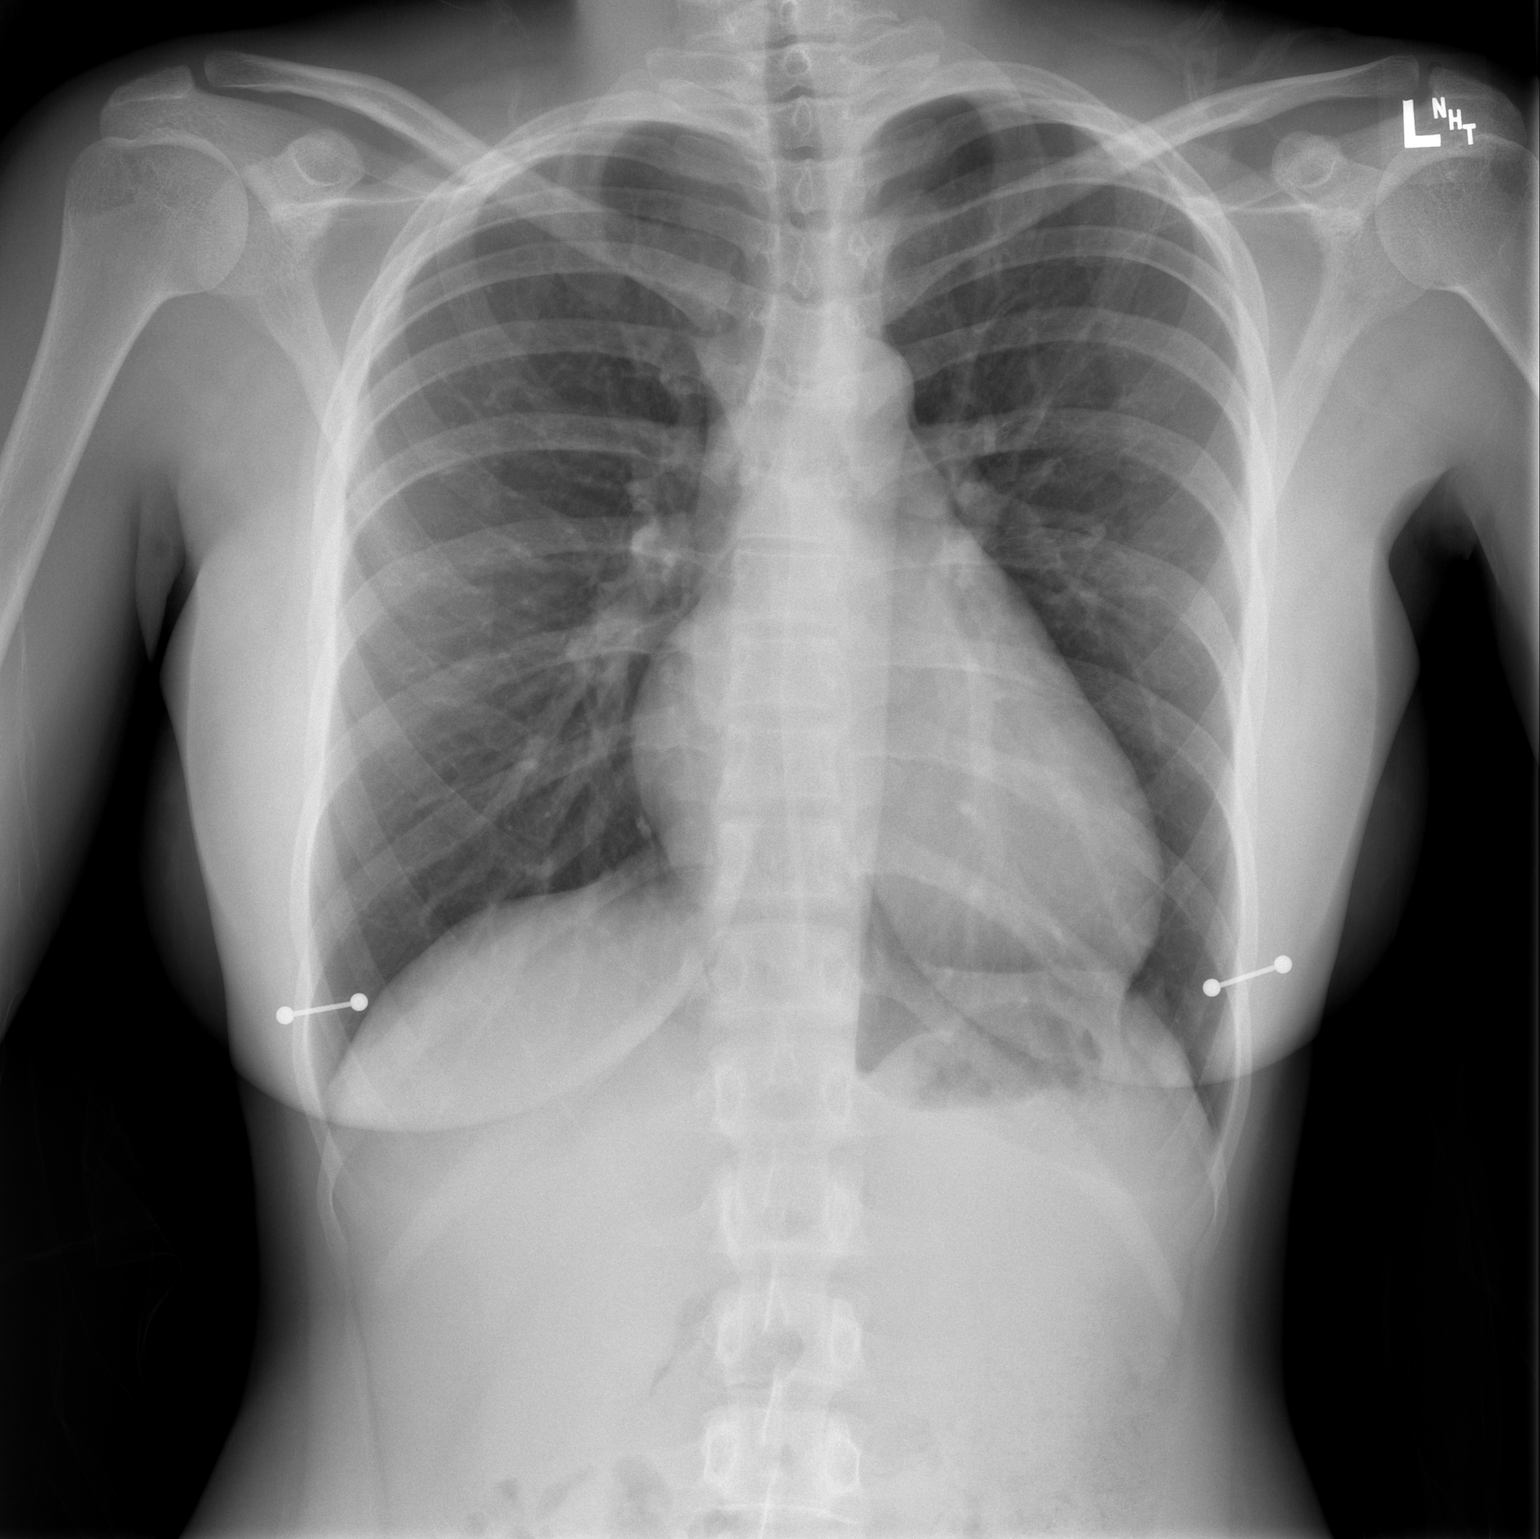

[w chest lat]
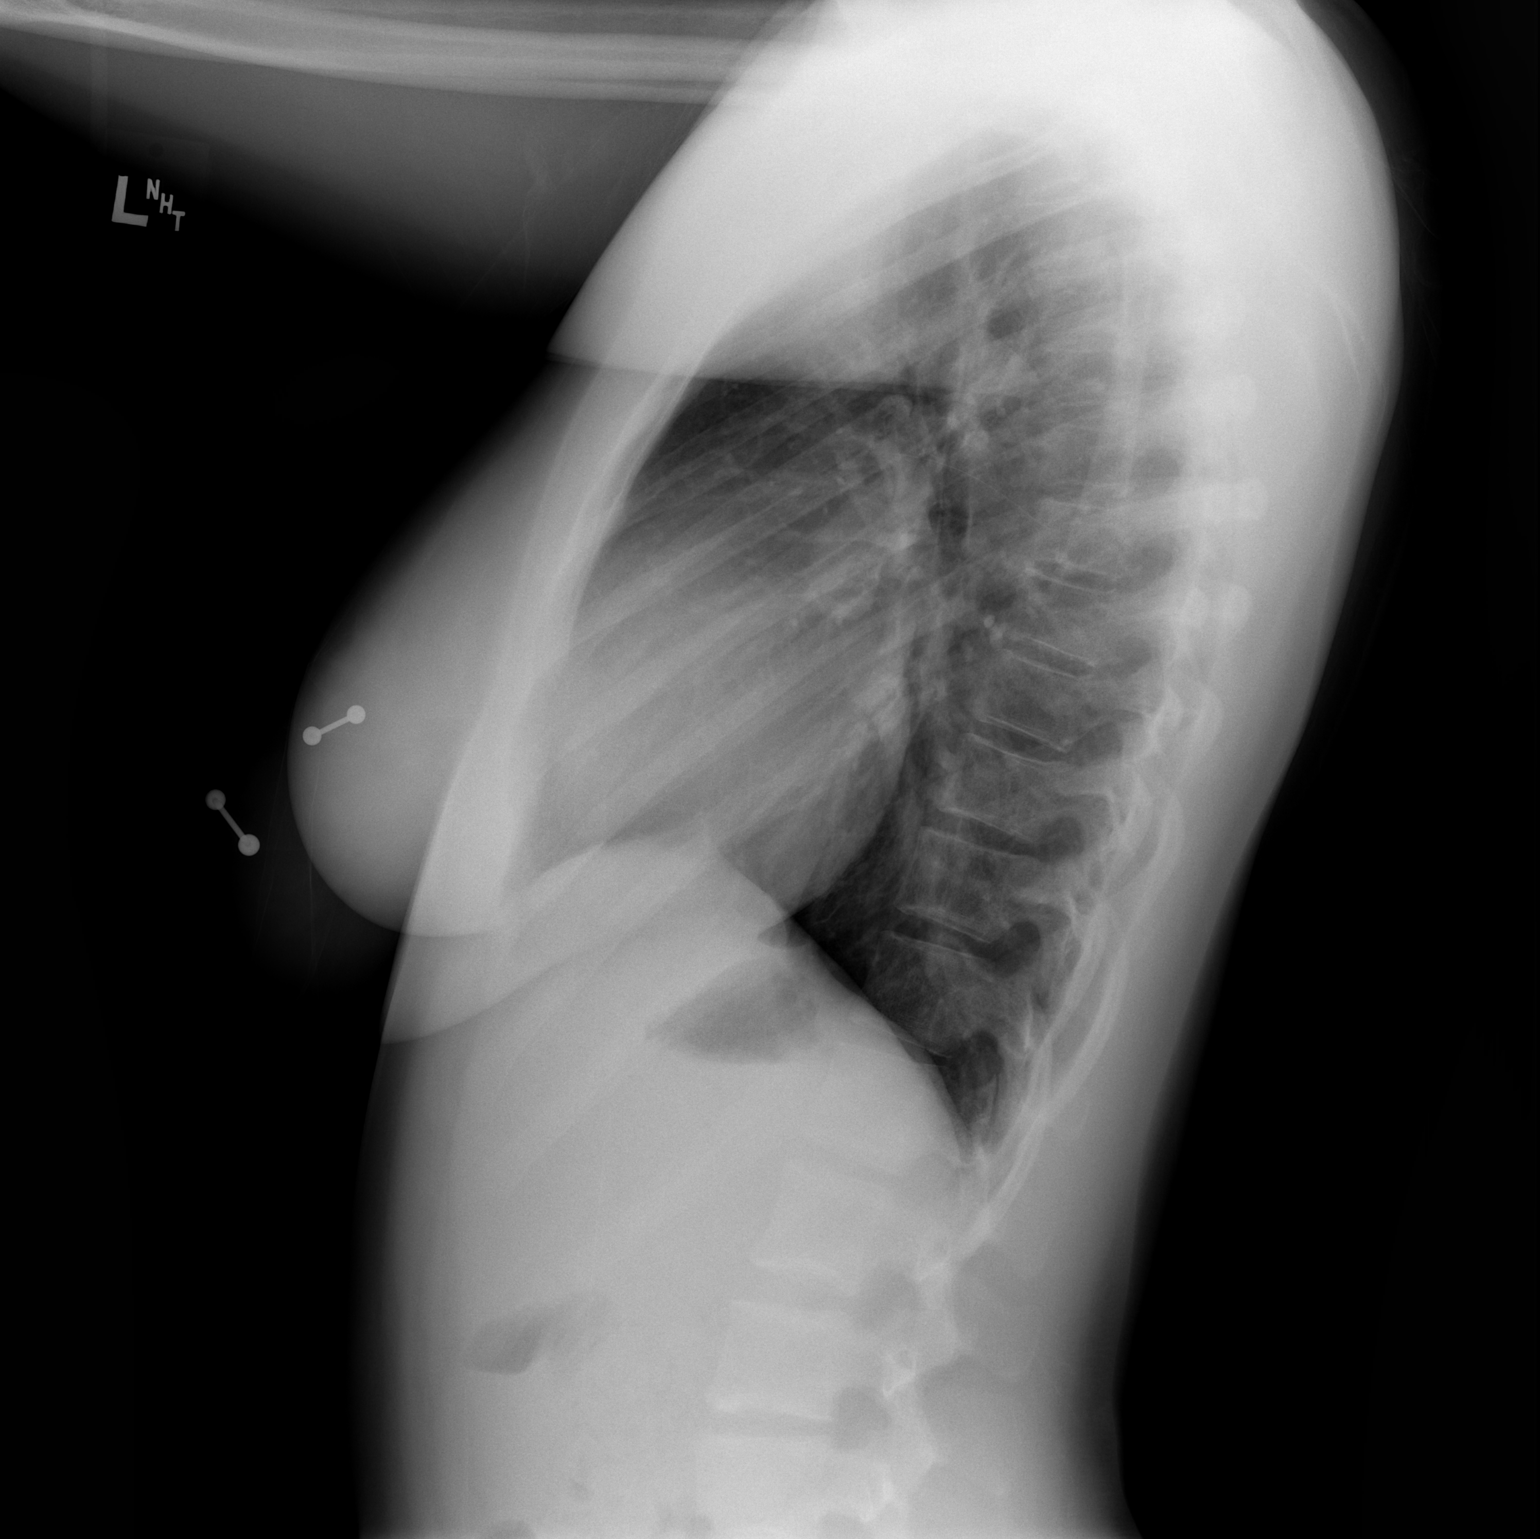

[2 of 2 positions shown; findings below may reference images not displayed]

FINDINGS: Lungs are adequately inflated without consolidation or effusion.
Cardiomediastinal silhouette, bones and soft tissues are within
normal.
IMPRESSION: No active cardiopulmonary disease.

## 2021-07-11 ENCOUNTER — Inpatient Hospital Stay
Admit: 2021-07-11 | Discharge: 2021-07-12 | Disposition: A | Discharge status: Home / Self Care | Payer: PRIVATE HEALTH INSURANCE | Attending: Emergency Medicine

## 2021-07-11 DIAGNOSIS — G44319 Acute post-traumatic headache, not intractable: Secondary | ICD-10-CM

## 2021-07-11 MED ORDER — NAPROXEN 250 MG TAB
250 mg | ORAL | Status: AC
Start: 2021-07-11 — End: 2021-07-11
  Administered 2021-07-11: via ORAL

## 2021-07-11 MED ORDER — NAPROXEN 375 MG TAB
375 mg | ORAL_TABLET | Freq: Two times a day (BID) | ORAL | 0 refills | Status: AC
Start: 2021-07-11 — End: ?

## 2021-07-11 MED ORDER — METOCLOPRAMIDE 10 MG TAB
10 mg | ORAL_TABLET | Freq: Four times a day (QID) | ORAL | 0 refills | Status: AC | PRN
Start: 2021-07-11 — End: 2021-07-21

## 2021-07-11 MED ORDER — METHOCARBAMOL 500 MG TAB
500 mg | ORAL | Status: DC
Start: 2021-07-11 — End: 2021-07-11

## 2021-07-11 MED ORDER — METHOCARBAMOL 500 MG TAB
500 mg | ORAL_TABLET | Freq: Four times a day (QID) | ORAL | 0 refills | Status: AC | PRN
Start: 2021-07-11 — End: ?

## 2021-07-11 MED ORDER — BUTALBITAL-ACETAMINOPHEN-CAFFEINE 50 MG-325 MG-40 MG TAB
50-325-40 mg | ORAL | Status: AC
Start: 2021-07-11 — End: 2021-07-11
  Administered 2021-07-11: via ORAL

## 2021-07-11 MED ORDER — BUTALBITAL-ACETAMINOPHEN-CAFFEINE 50 MG-325 MG-40 MG TAB
50-325-40 mg | ORAL_TABLET | Freq: Four times a day (QID) | ORAL | 0 refills | Status: AC | PRN
Start: 2021-07-11 — End: ?

## 2021-07-11 MED FILL — METHOCARBAMOL 500 MG TAB: 500 mg | ORAL | Qty: 1

## 2021-07-11 MED FILL — BUTALBITAL-ACETAMINOPHEN-CAFFEINE 50 MG-325 MG-40 MG TAB: 50-325-40 mg | ORAL | Qty: 1

## 2021-07-11 MED FILL — NAPROXEN 250 MG TAB: 250 mg | ORAL | Qty: 2
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
## 2021-07-11 NOTE — ED Notes (Signed)
7:59 PM  07/11/21     Discharge instructions given to patient (name) with verbalization of understanding. Patient accompanied by no one.  Patient discharged with the following prescriptions sent to pharmacy. Patient discharged to home (destination).      Renee Schanke

## 2023-07-13 LAB — RUBELLA TITER, EXTERNAL RESULT: Rubella Titer, External Result: IMMUNE

## 2023-07-13 LAB — RPR, EXTERNAL RESULT: RPR, External Result: NONREACTIVE

## 2023-07-13 LAB — HIV, EXTERNAL RESULT: HIV, External Result: NEGATIVE

## 2023-07-13 LAB — HEPATITIS B, EXTERNAL RESULT: Hep B, External Result: NEGATIVE

## 2023-12-23 NOTE — L&D Delivery Note (Signed)
Brief Postoperative Note    Patient: Christine Graves    MRN: [id number]    Date of Birth: 1996-10-20    Age: 28 y.o.    Admit Date: 01/21/2024    Attending Physician: Babette Relic, MD    Pre-Operative diagnosis: IUP 39.3 weeks, Polyhydramnios, Suspected Macrosomia, Breech presentation    Post-Operative Diagnosis: Same    Procedure: Procedure(s):  Primary LTCS    Findings: VFI with Apgars 8&9, Wt 4510g; Placenta with 3VC; Breech presentation    Surgeon: Babette Relic, MD    Assistants: B. Holder SA    Anesthesia Type: Spinal    Specimens Removed: Placenta    EBL: 800cc    Drains: Foley    Summary of Complications: None    Babette Relic, MD  January 21, 2024  9:18 PM        Markitta, [place] Girl Tanga [[id number]]      Labor Events    Preterm Labor: No  Antenatal Steroids: None  Cervical Ripening Date/Time:      Antibiotics Received during Labor: No  Rupture Date/Time:      Rupture Type: AROM  Fluid Color: Clear  Fluid Volume: Large       Anesthesia    Method: Spinal       Labor Length    3rd stage: 0h 46m       Delivery Details      Delivery Date: 01/21/24 Delivery Time: 20:25:00   Delivery Type: C-Section, Low Transverse  Trial of Labor?: No  C-Section Categorization: Primary  C-Section Priority: unscheduled  Indications for C-Section: Macrosomia, Breech       Skin Incision Type: Low Transverse  Uterine Incision: Low Transverse       Newborn Presentation    Presentation: Breech       Shoulder Dystocia    Shoulder Dystocia Present?: No       Assisted Delivery Details    Forceps Attempted?: No  Vacuum Extractor Attempted?: No                           Cord    Vessels: 3 Vessels  Complications: None  Delayed Cord Clamping?: Yes  Cord Clamped Date/Time: 01/21/2024 20:26:00  Cord Blood Disposition: Lab  Gases Sent?: No              Placenta    Date/Time: 01/21/2024 20:27:00  Removal: Spontaneous  Appearance: Intact  Disposition: Discarded       Lacerations    Episiotomy: None  Perineal Lacerations: None  Other Lacerations: no  non-perineal laceration       Blood Loss  Mother: Christine Graves, Christine Graves     Start of Mother's Information      Delivery Blood Loss   Intrapartum & Postpartum: 01/21/24 1951 - 01/21/24 2118    Delivery Admission: 01/21/24 1608 - 01/21/24 2118         Intrapartum & Postpartum Delivery Admission    Quantitative Blood Loss - Delivery (mL) Hospital Encounter [phone]    Total  948 mL 948 mL               End of Mother's Information  Mother: Christine Graves, Christine Graves                Delivery Providers    Delivering clinician: Babette Relic, MD     Provider Role    Babette Relic, MD Obstetrician    Daryel November, RN Primary Nurse  Alease Frame, RN Primary Newborn Nurse     NICU Nurse    Ryan-Smith, Moss Mc, MD Neonatologist     Anesthesiologist     Nurse Anesthetist     Nurse Practitioner     Midwife    Olene Craven, RN Nursery Nurse     Respiratory Therapist    Laural Benes, Patrice R Scrub Tech    Buck Mam Assistant Surgeon              Newborn Assessment    Living Status: Living        Skin Color:   Heart Rate:   Reflex Irritability:   Muscle Tone:   Respiratory Effort:   Total:            1 Minute:    0    2    2    2    2    8         5  Minute:    1    2    2    2    2    9                                         Apgars Assigned By: DR. RYAN SMITH              Resuscitation    Method: Bulb Suction, Room Air, Stimulation, Suctioning             Newborn Measurements      Birth Weight: 4510 g   Birth Length: 54.5 cm

## 2023-12-28 LAB — GBS, EXTERNAL RESULT: GBS, External Result: POSITIVE

## 2023-12-28 LAB — N. GONORRHOEAE, EXTERNAL RESULT: N. Gonorrhoeae, External Result: NEGATIVE

## 2023-12-28 LAB — RPR, EXTERNAL RESULT: RPR, External Result: NONREACTIVE

## 2023-12-28 LAB — C. TRACHOMATIS, EXTERNAL RESULT: C. Trachomatis, External Result: NEGATIVE

## 2024-01-21 ENCOUNTER — Inpatient Hospital Stay
Admit: 2024-01-21 | Discharge: 2024-01-23 | Disposition: A | Discharge status: Home / Self Care | Payer: BLUE CROSS/BLUE SHIELD | Attending: Obstetrics & Gynecology | Admitting: Obstetrics & Gynecology

## 2024-01-21 DIAGNOSIS — O409XX0 Polyhydramnios, unspecified trimester, not applicable or unspecified: Secondary | ICD-10-CM

## 2024-01-21 DIAGNOSIS — G8918 Other acute postprocedural pain: Secondary | ICD-10-CM

## 2024-01-21 DIAGNOSIS — O3663X0 Maternal care for excessive fetal growth, third trimester, not applicable or unspecified: Secondary | ICD-10-CM

## 2024-01-21 LAB — CBC
Hematocrit: 42.7 % (ref 35.0–47.0)
Hemoglobin: 14.2 g/dL (ref 11.0–16.0)
MCH: 27.5 pg (ref 25.4–34.6)
MCHC: 33.3 g/dL (ref 30.0–36.0)
MCV: 82.6 fL (ref 80.0–98.0)
MPV: 11.2 fL — ABNORMAL HIGH (ref 6.0–10.0)
Platelets: 203 10*3/uL (ref 140–450)
RBC: 5.17 M/uL (ref 3.60–5.20)
RDW: 49.1 — ABNORMAL HIGH (ref 36.4–46.3)
WBC: 11.1 10*3/uL — ABNORMAL HIGH (ref 4.0–11.0)

## 2024-01-21 LAB — BASIC METABOLIC PANEL
Anion Gap: 10 mmol/L (ref 5–15)
BUN: 7 mg/dL — ABNORMAL LOW (ref 9–23)
CO2: 20 meq/L (ref 20–31)
Calcium: 9.9 mg/dL (ref 8.7–10.4)
Chloride: 105 meq/L (ref 98–107)
Creatinine: 0.89 mg/dL (ref 0.55–1.02)
GFR African American: >60
GFR Non-African American: >60
Glucose: 81 mg/dL (ref 74–106)
Potassium: 3.7 meq/L (ref 3.5–5.1)
Sodium: 135 meq/L — ABNORMAL LOW (ref 136–145)

## 2024-01-21 LAB — ABO/RH: ABO/Rh: O NEG

## 2024-01-21 LAB — ANTIBODY SCREEN: Antibody Screen: POSITIVE

## 2024-01-21 MED ORDER — OXYTOCIN 30 UNITS IN 500 ML INFUSION
30500 | INTRAVENOUS | Status: AC | PRN
Start: 2024-01-21 — End: 2024-01-21
  Administered 2024-01-22: 01:00:00 10 [IU] via INTRAVENOUS

## 2024-01-21 MED ORDER — ONDANSETRON HCL 4 MG/2ML IJ SOLN
42 | Freq: Four times a day (QID) | INTRAMUSCULAR | Status: DC | PRN
Start: 2024-01-21 — End: 2024-01-22

## 2024-01-21 MED ORDER — OXYTOCIN 30 UNITS IN 500 ML INFUSION
30500 UNIT/500ML | INTRAVENOUS | Status: AC | PRN
Start: 2024-01-21 — End: 2024-01-23
  Administered 2024-01-22: 02:00:00 87.3 m[IU]/min via INTRAVENOUS

## 2024-01-21 MED ORDER — SODIUM CHLORIDE 0.9 % IV SOLN
0.9 | INTRAVENOUS | Status: DC | PRN
Start: 2024-01-21 — End: 2024-01-22

## 2024-01-21 MED ORDER — LACTATED RINGERS IV BOLUS
Freq: Once | INTRAVENOUS | Status: AC
Start: 2024-01-21 — End: 2024-01-21
  Administered 2024-01-21: 22:00:00 1000 mL via INTRAVENOUS

## 2024-01-21 MED ORDER — CEFAZOLIN SODIUM-DEXTROSE 2-4 GM/100ML-% IV SOLN
2-4100- | Freq: Once | INTRAVENOUS | Status: AC
Start: 2024-01-21 — End: 2024-01-21
  Administered 2024-01-22: 01:00:00 2000 mg via INTRAVENOUS

## 2024-01-21 MED ORDER — NORMAL SALINE FLUSH 0.9 % IV SOLN
0.9 | INTRAVENOUS | Status: DC | PRN
Start: 2024-01-21 — End: 2024-01-22

## 2024-01-21 MED ORDER — LACTATED RINGERS IV SOLN
INTRAVENOUS | Status: DC
Start: 2024-01-21 — End: 2024-01-22
  Administered 2024-01-22 (×2): via INTRAVENOUS

## 2024-01-21 MED FILL — LACTATED RINGERS IV SOLN: INTRAVENOUS | Qty: 1000

## 2024-01-21 MED FILL — CEFAZOLIN SODIUM-DEXTROSE 2-4 GM/100ML-% IV SOLN: 2-4100- GM/100ML-% | INTRAVENOUS | Qty: 100

## 2024-01-21 NOTE — Progress Notes (Addendum)
1930: SBAR shift report received from [employer]. Care assumed    1951: In OR 1  2025: Viable infant girl delivered, APGARS 8/9  2124: Out of OR  2125: In PACU   2330: Out of PACU-Patient transferred to [facility] 4118 in stable condition. SBAR report given to [employer]. Care transferred

## 2024-01-21 NOTE — Op Note (Signed)
 [facility]  Operation Report  NAME:  Christine Graves, Christine Graves  SEX:   F  DATE: 01/21/2024  DOB: 01-23-1996  MR#    [id number]  ROOM:  RR1  ACCT#  [id number]    cc: Babette Relic MD      PREOPERATIVE DIAGNOSES:   Intrauterine pregnancy at 33 and 3/7 weeks, polyhydramnios, suspected macrosomia and breech presentation.     POSTOPERATIVE DIAGNOSES:   Same.     PROCEDURE:   Primary low transverse cesarean section.     SURGEON:   Dr. Butch Penny.     ASSISTANT:   B. Holder, SA     ANESTHESIA:   Spinal.     ESTIMATED BLOOD LOSS:   800 mL.     DRAINS:   Foley catheter.     COMPLICATIONS:   None.     FINDINGS:   Viable female infant with Apgars 8 and 9 weight 4510 grams; placenta with 3-vessel cord; Frank breech presentation.     DESCRIPTION OF PROCEDURE:   The patient was taken to the operating room and given spinal anesthesia while in the upright position.  The patient was placed in the supine position with left lateral tilt and fetal heart tones documented.  A Foley catheter was placed.  The patient was prepped and sterilely draped.  A Pfannenstiel skin incision was made in lower abdomen and carried to the fascia with a sharp knife.  The fascia was incised.  The rectus muscles were bluntly divided.  The peritoneum was grasped, elevated and entered.  The peritoneum was extended cephalad to caudad.  The bladder blade was placed.  A bladder flap was developed from the overlying vesicouterine peritoneum and the bladder blade replaced.  A low transverse uterine incision was made which was extended bluntly.  The membranes were ruptured with clear fluid noted.  The infant was delivered from a frank breech presentation without difficulty.  Vigorous suctioning was carried out on the field as delayed cord clamping was performed for 30 seconds.  The cord was doubly clamped and cut.  The infant was handed off to the awaiting pediatrician.  The placenta was manually extracted.  The uterus was exteriorized and cleaned with a moist lap  tape.  The uterus was closed in two layers using 0 chromic suture.  A running locked stitch was used on the first layer followed by imbricating stitch on the second.  There was oozing at the incision line which was controlled with additional figure-of-eight stitches of 0 chromic suture.  The ovaries and tubes were inspected and found to be normal.  The uterus was placed back into the abdominal cavity and the gutters cleaned.  The peritoneum was closed with a running stitch of 2-0 chromic suture.  The fascia was closed with a running intermittently locked stitch of 0 Vicryl suture.  The subcutaneous tissue was irrigated with small bleeders controlled with cautery.  The subcutaneous tissue was reapproximated with a 2-0 plain suture.  The skin was closed with a subcuticular stitch of 4-0 Vicryl suture.  An Aquacel dressing was applied to the site.  The patient tolerated the procedure well and there were no complications.  All sponge and needle counts were correct at the end of the procedure.  The Foley catheter had drained clear urine at the start of the case and at the end.  The patient was transferred to the recovery room in stable condition.      ___________________  Babette Relic MD   Dictated EA:[id number] Butch Penny,  MD  SB  D: 01/21/2024 21:47:12  T: 01/21/2024 53:66:44  [id number]

## 2024-01-21 NOTE — Anesthesia Procedure Notes (Signed)
Spinal Block    End time: 01/21/2024 8:03 PM  Reason for block: primary anesthetic  Staffing  Performed: anesthesiologist   Anesthesiologist: Arneta Cliche, MD  Resident/CRNA: Henrine Screws, APRN - CRNA  Performed by: Henrine Screws, APRN - CRNA  Authorized by: Arneta Cliche, MD    Spinal Block  Patient position: sitting  Prep: Betadine  Patient monitoring: cardiac monitor, continuous pulse ox and frequent blood pressure checks  Approach: midline  Location: L4/L5  Provider prep: mask  Needle  Needle type: Pencan   Needle gauge: 25 G  Needle length: 3.5 in  Assessment  CSF: clear  Hemodynamics: stable  Preanesthetic Checklist  Completed: patient identified, IV checked, site marked, risks and benefits discussed, surgical/procedural consents, equipment checked, pre-op evaluation, timeout performed, anesthesia consent given, oxygen available, monitors applied/VS acknowledged, fire risk safety assessment completed and verbalized and blood product R/B/A discussed and consented

## 2024-01-21 NOTE — Anesthesia Post-Procedure Evaluation (Signed)
Department of Anesthesiology  Postprocedure Note    Patient: Christine Graves  MRN: [id number]  Birthdate: 15-Jul-1996  Date of evaluation: 01/21/2024    Procedure Summary       Date: 01/21/24 Room / Location: [facility] LD OR 01 / [facility] L&D OR    Anesthesia Start: 1951 Anesthesia Stop: 2130    Procedure: CESAREAN SECTION (Abdomen) Diagnosis:       S/P primary low transverse C-section      Spontaneous breech delivery, single or unspecified fetus      (S/P primary low transverse C-section [I95.188])      (Spontaneous breech delivery, single or unspecified fetus [O32.1XX0])    Surgeons: Babette Relic, MD Responsible Provider: Arneta Cliche, MD    Anesthesia Type: MAC, Spinal ASA Status: 2            Anesthesia Type: MAC, Spinal    Aldrete Phase I: Aldrete Score: 10    Aldrete Phase II:      Anesthesia Post Evaluation    Patient location during evaluation: PACU  Patient participation: complete - patient participated  Level of consciousness: awake and alert  Pain score: 0  Airway patency: patent  Nausea & Vomiting: no vomiting and no nausea  Cardiovascular status: hemodynamically stable  Respiratory status: acceptable  Hydration status: euvolemic  Multimodal analgesia pain management approach  Pain management: adequate    No notable events documented.

## 2024-01-21 NOTE — Plan of Care (Signed)
 Problem: Vaginal Birth or Cesarean Section  Goal: Fetal and maternal status remain reassuring during the birth process  Description:  Birth OB-Pregnancy care plan goal which identifies if the fetal and maternal status remain reassuring during the birth process  Outcome: Progressing     Problem: Pain  Goal: Verbalizes/displays adequate comfort level or baseline comfort level  Outcome: Progressing     Problem: Infection - Adult  Goal: Absence of infection at discharge  Outcome: Progressing  Goal: Absence of infection during hospitalization  Outcome: Progressing  Goal: Absence of fever/infection during anticipated neutropenic period  Outcome: Progressing     Problem: Safety - Adult  Goal: Free from fall injury  Outcome: Progressing     Problem: Discharge Planning  Goal: Discharge to home or other facility with appropriate resources  Outcome: Progressing     Problem: Chronic Conditions and Co-morbidities  Goal: Patient's chronic conditions and co-morbidity symptoms are monitored and maintained or improved  Outcome: Progressing

## 2024-01-21 NOTE — Progress Notes (Signed)
This G1P0 39.3 pt arrives for primary c-section d/t polyhydramnios, macrosomia and breech presentation. Pt gowned, EFM applied. Pt oriented to room/call system. Assessment started. Pt has no c/o at this time.

## 2024-01-21 NOTE — Anesthesia Pre-Procedure Evaluation (Signed)
Department of Anesthesiology  Preprocedure Note       Name:  Christine Graves   Age:  28 y.o.  DOB:  1996/07/07                                          MRN:  [id number]         Date:  01/21/2024      Surgeon: Moishe Spice):  Babette Relic, MD    Procedure: Procedure(s):  CESAREAN SECTION    Medications prior to admission:   Prior to Admission medications    Medication Sig Start Date End Date Taking? Authorizing Provider   Prenatal Vit-Fe Fumarate-FA (PRENATAL VITAMINS) 27-0.8 MG TABS Take 1 tablet by mouth daily   Yes [provider]   ferrous sulfate (IRON 325) 325 (65 Fe) MG tablet Take 1 tablet by mouth daily (with breakfast)   Yes [provider]       Current medications:    Current Facility-Administered Medications   Medication Dose Route Frequency Provider Last Rate Last Admin   . lactated ringers infusion   IntraVENous Continuous Babette Relic, MD       . lactated ringers bolus 1,000 mL  1,000 mL IntraVENous Once Babette Relic, MD       . sodium chloride flush 0.9 % injection 10 mL  10 mL IntraVENous PRN Babette Relic, MD       . 0.9 % sodium chloride infusion   IntraVENous PRN Babette Relic, MD       . oxytocin (PITOCIN) 30 units in 500 mL infusion  87.3 milli-units/min IntraVENous Continuous PRN Babette Relic, MD        And   . oxytocin (PITOCIN) 30 units in 500 mL infusion  10 Units IntraVENous PRN Babette Relic, MD       . ondansetron [facility]) injection 4 mg  4 mg IntraVENous Q6H PRN Babette Relic, MD       . ceFAZolin (ANCEF) 2000 mg in dextrose 4 % 100 mL IVPB (premix)  2,000 mg IntraVENous Once Babette Relic, MD           Allergies:    Allergies   Allergen Reactions   . Amoxicillin Rash       Problem List:    Patient Active Problem List   Diagnosis Code   . Anemia D64.9   . Eczema L30.9   . Polyhydramnios affecting pregnancy O40.9XX0       Past Medical History:        Diagnosis Date   . Acid reflux    . Asthma    . Eczema        Past Surgical History:  History reviewed. No  pertinent surgical history.    Social History:    Social History     Tobacco Use   . Smoking status: Never   . Smokeless tobacco: Never   Substance Use Topics   . Alcohol use: No                                Counseling given: Not Answered      Vital Signs (Current):   Vitals:    01/21/24 1721   Weight: 92.5 kg (204 lb)   Height: 1.727 m (5\' 8" )  BP Readings from Last 3 Encounters:   07/02/12 110/70 (45%, Z = -0.13 /  69%, Z = 0.50)*   05/07/12 100/60 (11%, Z = -1.23 /  23%, Z = -0.74)*     *BP percentiles are based on the 2017 AAP Clinical Practice Guideline for girls       NPO Status:                                                                                 BMI:   Wt Readings from Last 3 Encounters:   01/21/24 92.5 kg (204 lb)   07/02/12 64.9 kg (143 lb)   05/07/12 62.1 kg (137 lb)     Body mass index is 31.02 kg/m.    CBC:   Lab Results   Component Value Date/Time    WBC 11.1 01/21/2024 04:41 PM    RBC 5.17 01/21/2024 04:41 PM    HGB 14.2 01/21/2024 04:41 PM    HCT 42.7 01/21/2024 04:41 PM    MCV 82.6 01/21/2024 04:41 PM    RDW 49.1 01/21/2024 04:41 PM    PLT 203 01/21/2024 04:41 PM       CMP:   Lab Results   Component Value Date/Time    NA 135 01/21/2024 04:41 PM    K 3.7 01/21/2024 04:41 PM    CL 105 01/21/2024 04:41 PM    CO2 20 01/21/2024 04:41 PM    BUN 7 01/21/2024 04:41 PM    CREATININE 0.89 01/21/2024 04:41 PM    GFRAA >60.0 01/21/2024 04:41 PM    LABGLOM >60 01/21/2024 04:41 PM    GLUCOSE 81 01/21/2024 04:41 PM    CALCIUM 9.9 01/21/2024 04:41 PM       POC Tests: No results for input(s): "POCGLU", "POCNA", "POCK", "POCCL", "POCBUN", "POCHEMO", "POCHCT" in the last 72 hours.    Coags: No results found for: "PROTIME", "INR", "APTT"    HCG (If Applicable): No results found for: "PREGTESTUR", "PREGSERUM", "HCG", "HCGQUANT"     ABGs: No results found for: "PHART", "PO2ART", "PCO2ART", "HCO3ART", "BEART", "O2SATART"     Type & Screen (If Applicable):  Lab  Results   Component Value Date    ABORH O Rh Negative 01/21/2024    LABANTI POS 01/21/2024       Drug/Infectious Status (If Applicable):  No results found for: "HIV", "HEPCAB"    COVID-19 Screening (If Applicable): No results found for: "COVID19"        Anesthesia Evaluation  Patient summary reviewed  Airway: Mallampati: II  TM distance: >3 FB   Neck ROM: full  Mouth opening: > = 3 FB   Dental: normal exam         Pulmonary:normal exam  breath sounds clear to auscultation  (+)           asthma:                            Cardiovascular:Negative CV ROS  Exercise tolerance: good (>4 METS)          Rhythm: regular  Rate: normal  Neuro/Psych:   Negative Neuro/Psych ROS              GI/Hepatic/Renal:   (+) GERD: poorly controlled          Endo/Other: Negative Endo/Other ROS                    Abdominal: normal exam            Vascular: negative vascular ROS.         Other Findings:       Anesthesia Plan      general, spinal and epidural     ASA 2           MIPS: Postoperative opioids intended.  Anesthetic plan and risks discussed with patient and spouse.    Use of blood products discussed with patient and spouse whom consented to blood products.    Plan discussed with CRNA.    Attending anesthesiologist reviewed and agrees with Preprocedure content            Arneta Cliche, MD   01/21/2024

## 2024-01-21 NOTE — H&P (Signed)
OB History and Physical     Patient: Christine Graves    MRN: [id number]    Date of Birth: 06/02/1996    Age: 28 y.o.    Admit Date: 01/21/2024    Attending Physician: Babette Relic, MD     Chief Complaint:   Chief Complaint   Patient presents with    Scheduled C-section       Brief History: 28 year old gravida 1 para 0 black female at 39.[redacted] weeks EGA and EDD 01/25/2024 who is admitted for primary C/S. Pt has been followed by our practice for this pregnancy. Pt had [place] for growth last week  and was dxed with polyhydramnios and fetal macrosomia. EFW was 4810g at >99%tile and  both AFI 28.5 and MVP 11.7 were increased c/w polyhydramnios. Fetus was in breech presentation. NST was reactive. Pt had 1hrGTT 93 WNL. F/U US today with continued polyhydramnios with AFI now increased to 32 cm and persistent breech presentation.     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 Pt was seen by MFM for genetic counseling and testing for FOB negative. Pt was also seen by MFM secondary to ICEF on [place] and she had NIPT low risk for aneuploidy. Pt was counseled by MFM that she was low risk and no further evaluation was needed. Pt with Blood Type O Negative and s/p Rhogam 11/11/2024.    Prenatal Labs: Blood type O Negative, Rubella Immune, GBS+    Past Medical History:     Past Medical History:   Diagnosis Date    Acid reflux     Asthma     Eczema      Past Surgical History:  History reviewed. No pertinent surgical history.    Social History:  Occasional alcohol use; Denies tobacco or illicit drug use    Current Medications: PNV po qd, Iron 65 mg po qd  Current Facility-Administered Medications   Medication Dose Route Frequency    lactated ringers infusion   IntraVENous Continuous    lactated ringers bolus 1,000 mL  1,000 mL IntraVENous Once    sodium chloride flush 0.9 % injection 10 mL  10 mL IntraVENous PRN    0.9 % sodium chloride infusion   IntraVENous PRN    oxytocin (PITOCIN) 30 units in 500 mL infusion  87.3  milli-units/min IntraVENous Continuous PRN    And    oxytocin (PITOCIN) 30 units in 500 mL infusion  10 Units IntraVENous PRN    ondansetron (ZOFRAN) injection 4 mg  4 mg IntraVENous Q6H PRN    ceFAZolin (ANCEF) 2000 mg in dextrose 4 % 100 mL IVPB (premix)  2,000 mg IntraVENous Once       Allergies:   Allergies   Allergen Reactions    Amoxicillin Rash       Review of System:    Patient summary reviewed, nursing notes reviewed and pertinent labs reviewed      Pulmonary  Within defined limits    Neuro/Psych    Within defined limits    Cardiovascular   Within defined limits   GI/Hepatic/Renal  Within defined limits      Endo/Other   Within defined limits Other Findings               Physical Exam:  T 98.6  BP 135/79  P 73  R 18  Vitals:    01/21/24 1721   Weight: 92.5 kg (204 lb)   Height: 1.727 m (5\' 8" )  Abdomen gravida, consistent with gestational age                       Pelvic: cervix deferred                         External Fetal Monitoring: Baseline fetal heart rate 135 reactive; Toco irregular ctx    Lab:  Recent Results (from the past 24 hour(s))   Basic Metabolic Panel    Collection Time: 01/21/24  4:41 PM   Result Value Ref Range    Potassium 3.7 3.5 - 5.1 mEq/L    Chloride 105 98 - 107 mEq/L    Sodium 135 (L) 136 - 145 mEq/L    CO2 20 20 - 31 mEq/L    Glucose 81 74 - 106 mg/dl    BUN 7 (L) 9 - 23 mg/dl    Creatinine 9.81 1.91 - 1.02 mg/dl    GFR African American >60.0      GFR Non-African American >60      Calcium 9.9 8.7 - 10.4 mg/dl    Anion Gap 10 5 - 15 mmol/L   CBC    Collection Time: 01/21/24  4:41 PM   Result Value Ref Range    WBC 11.1 (H) 4.0 - 11.0 1000/mm3    RBC 5.17 3.60 - 5.20 M/uL    Hemoglobin 14.2 11.0 - 16.0 gm/dl    Hematocrit 47.8 29.5 - 47.0 %    MCV 82.6 80.0 - 98.0 fL    MCH 27.5 25.4 - 34.6 pg    MCHC 33.3 30.0 - 36.0 gm/dl    Platelets 621 308 - 450 1000/mm3    MPV 11.2 (H) 6.0 - 10.0 fL    RDW 49.1 (H) 36.4 - 46.3         Impression: IUP 39.3 week,  Polyhydramnios, Suspected Macrosomia, Breech presentation    Plan: PLTCS    Babette Relic, MD  January 21, 2024  5:59 PM

## 2024-01-22 LAB — CBC
Hematocrit: 37.1 % (ref 35.0–47.0)
Hemoglobin: 12.1 g/dL (ref 11.0–16.0)
MCH: 27.3 pg (ref 25.4–34.6)
MCHC: 32.6 g/dL (ref 30.0–36.0)
MCV: 83.7 fL (ref 80.0–98.0)
MPV: 11.5 fL — ABNORMAL HIGH (ref 6.0–10.0)
Platelets: 181 10*3/uL (ref 140–450)
RBC: 4.43 M/uL (ref 3.60–5.20)
RDW: 49 — ABNORMAL HIGH (ref 36.4–46.3)
WBC: 14.2 10*3/uL — ABNORMAL HIGH (ref 4.0–11.0)

## 2024-01-22 LAB — RPR W/REFLEX TITER AND CONFIRMATION: RPR: NONREACTIVE

## 2024-01-22 LAB — ANTIBODY IDENTIFICATION

## 2024-01-22 LAB — RH IMMUNE GLOBULIN PROPHYLACTIC: Fetal Bleed Screen: NEGATIVE

## 2024-01-22 MED ORDER — FENTANYL CITRATE (PF) 100 MCG/2ML IJ SOLN
1002 | INTRAMUSCULAR | Status: AC
Start: 2024-01-22 — End: ?

## 2024-01-22 MED ORDER — NORMAL SALINE FLUSH 0.9 % IV SOLN
0.9 | INTRAVENOUS | Status: DC | PRN
Start: 2024-01-22 — End: 2024-01-21

## 2024-01-22 MED ORDER — FENTANYL CITRATE (PF) 100 MCG/2ML IJ SOLN
1002 | INTRAMUSCULAR | Status: AC | PRN
Start: 2024-01-22 — End: 2024-01-21
  Administered 2024-01-22 (×2): 25 ug via INTRAVENOUS

## 2024-01-22 MED ORDER — MEASLES, MUMPS & RUBELLA VAC IJ SOLR
INTRAMUSCULAR | Status: AC
Start: 2024-01-22 — End: 2024-01-23

## 2024-01-22 MED ORDER — OXYCODONE HCL 5 MG PO TABS
5 | ORAL | Status: DC | PRN
Start: 2024-01-22 — End: 2024-01-23
  Administered 2024-01-22 – 2024-01-23 (×7): 5 mg via ORAL

## 2024-01-22 MED ORDER — SODIUM CHLORIDE 0.9 % IV SOLN
0.9 % | INTRAVENOUS | Status: AC | PRN
Start: 2024-01-22 — End: 2024-01-23

## 2024-01-22 MED ORDER — TRANEXAMIC ACID 1000 MG/10ML IV SOLN
1000 MG/10ML | Freq: Once | INTRAVENOUS | Status: AC | PRN
Start: 2024-01-22 — End: 2024-01-23

## 2024-01-22 MED ORDER — DEXAMETHASONE SODIUM PHOSPHATE 10 MG/ML IJ SOLN
10 | Freq: Once | INTRAMUSCULAR | Status: DC | PRN
Start: 2024-01-22 — End: 2024-01-21
  Administered 2024-01-22: 02:00:00 10 via INTRAVENOUS

## 2024-01-22 MED ORDER — SODIUM CHLORIDE (PF) 0.9 % IJ SOLN
0.9 | INTRAMUSCULAR | Status: DC | PRN
Start: 2024-01-22 — End: 2024-01-21

## 2024-01-22 MED ORDER — LIDOCAINE HCL 1 % IJ SOLN
1 | Freq: Once | INTRAMUSCULAR | Status: DC | PRN
Start: 2024-01-22 — End: 2024-01-21
  Administered 2024-01-22: 01:00:00 3 via INTRADERMAL

## 2024-01-22 MED ORDER — RHO D IMMUNE GLOBULIN 1500 UNITS IM (WRAPPER)
1500 units | Freq: Once | Status: AC | PRN
Start: 2024-01-22 — End: 2024-01-22
  Administered 2024-01-22: 23:00:00 300 ug via INTRAMUSCULAR

## 2024-01-22 MED ORDER — TETANUS-DIPHTH-ACELL PERTUSSIS 5-2.5-18.5 LF-MCG/0.5 IM SUSY
5-2.5-18.5-0.5 | INTRAMUSCULAR | Status: AC
Start: 2024-01-22 — End: 2024-01-23
  Administered 2024-01-23: 14:00:00 0.5 mL via INTRAMUSCULAR

## 2024-01-22 MED ORDER — PHENYLEPHRINE HCL 10 MG/ML SOLN (MIXTURES ONLY)
10 | INTRAVENOUS | Status: DC | PRN
Start: 2024-01-22 — End: 2024-01-21
  Administered 2024-01-22: 01:00:00 20 via INTRAVENOUS

## 2024-01-22 MED ORDER — KETOROLAC TROMETHAMINE 30 MG/ML IJ SOLN
30 | Freq: Once | INTRAMUSCULAR | Status: DC | PRN
Start: 2024-01-22 — End: 2024-01-21
  Administered 2024-01-22: 02:00:00 30 via INTRAVENOUS

## 2024-01-22 MED ORDER — ONDANSETRON HCL 4 MG/2ML IJ SOLN
42 | Freq: Once | INTRAMUSCULAR | Status: DC | PRN
Start: 2024-01-22 — End: 2024-01-21

## 2024-01-22 MED ORDER — ONDANSETRON 4 MG PO TBDP
4 MG | Freq: Three times a day (TID) | ORAL | Status: AC | PRN
Start: 2024-01-22 — End: 2024-01-23

## 2024-01-22 MED ORDER — BUPIVACAINE IN DEXTROSE 0.75-8.25 % IT SOLN
0.75-8.25 | Freq: Once | INTRATHECAL | Status: DC | PRN
Start: 2024-01-22 — End: 2024-01-21
  Administered 2024-01-22: 01:00:00 1.6

## 2024-01-22 MED ORDER — NORMAL SALINE FLUSH 0.9 % IV SOLN
0.9 % | INTRAVENOUS | Status: AC | PRN
Start: 2024-01-22 — End: 2024-01-23

## 2024-01-22 MED ORDER — ONDANSETRON HCL 4 MG/2ML IJ SOLN
42 | Freq: Once | INTRAMUSCULAR | Status: DC | PRN
Start: 2024-01-22 — End: 2024-01-21
  Administered 2024-01-22: 02:00:00 4 via INTRAVENOUS

## 2024-01-22 MED ORDER — CARBOPROST TROMETHAMINE 250 MCG/ML IM SOLN
250 MCG/ML | INTRAMUSCULAR | Status: AC | PRN
Start: 2024-01-22 — End: 2024-01-23

## 2024-01-22 MED ORDER — NORMAL SALINE FLUSH 0.9 % IV SOLN
0.9 | Freq: Two times a day (BID) | INTRAVENOUS | Status: DC
Start: 2024-01-22 — End: 2024-01-21

## 2024-01-22 MED ORDER — DIPHENHYDRAMINE HCL 25 MG PO CAPS
25 | Freq: Four times a day (QID) | ORAL | Status: DC | PRN
Start: 2024-01-22 — End: 2024-01-23
  Administered 2024-01-22 – 2024-01-23 (×3): 25 mg via ORAL

## 2024-01-22 MED ORDER — IBUPROFEN 400 MG PO TABS
400 MG | Freq: Three times a day (TID) | ORAL | Status: AC
Start: 2024-01-22 — End: 2024-01-23
  Administered 2024-01-23: 08:00:00 800 mg via ORAL

## 2024-01-22 MED ORDER — LANSINOH LANOLIN EX CREA
CUTANEOUS | Status: AC | PRN
Start: 2024-01-22 — End: 2024-01-23

## 2024-01-22 MED ORDER — FENTANYL CITRATE (PF) 100 MCG/2ML IJ SOLN
1002 | Freq: Once | INTRAMUSCULAR | Status: DC | PRN
Start: 2024-01-22 — End: 2024-01-21

## 2024-01-22 MED ORDER — KETOROLAC TROMETHAMINE 30 MG/ML IJ SOLN
30 | Freq: Four times a day (QID) | INTRAMUSCULAR | Status: AC
Start: 2024-01-22 — End: 2024-01-23
  Administered 2024-01-22 (×3): 30 mg via INTRAVENOUS

## 2024-01-22 MED ORDER — MORPHINE SULFATE (PF) 0.5 MG/ML IJ SOLN
0.5 | Freq: Once | INTRAMUSCULAR | Status: DC | PRN
Start: 2024-01-22 — End: 2024-01-21
  Administered 2024-01-22: 02:00:00 1 via INTRASPINAL
  Administered 2024-01-22: 01:00:00 .2 via INTRASPINAL
  Administered 2024-01-22: 02:00:00 .8 via INTRASPINAL
  Administered 2024-01-22 (×2): 1.5 via INTRASPINAL

## 2024-01-22 MED ORDER — SODIUM CHLORIDE 0.9 % IV SOLN
0.9 | INTRAVENOUS | Status: DC | PRN
Start: 2024-01-22 — End: 2024-01-21

## 2024-01-22 MED ORDER — NORMAL SALINE FLUSH 0.9 % IV SOLN
0.9 % | Freq: Two times a day (BID) | INTRAVENOUS | Status: AC
Start: 2024-01-22 — End: 2024-01-23
  Administered 2024-01-23: 02:00:00 10 mL via INTRAVENOUS

## 2024-01-22 MED ORDER — ACETAMINOPHEN 10 MG/ML IV SOLN
10 | Freq: Once | INTRAVENOUS | Status: DC | PRN
Start: 2024-01-22 — End: 2024-01-21
  Administered 2024-01-22: 02:00:00 1000 via INTRAVENOUS

## 2024-01-22 MED ORDER — ACETAMINOPHEN 10 MG/ML IV SOLN
10 | INTRAVENOUS | Status: AC
Start: 2024-01-22 — End: ?

## 2024-01-22 MED ORDER — DOCUSATE SODIUM 100 MG PO CAPS
100 MG | Freq: Two times a day (BID) | ORAL | Status: AC
Start: 2024-01-22 — End: 2024-01-23
  Administered 2024-01-22 – 2024-01-23 (×3): 100 mg via ORAL

## 2024-01-22 MED ORDER — ONDANSETRON HCL 4 MG/2ML IJ SOLN
42 MG/2ML | Freq: Four times a day (QID) | INTRAMUSCULAR | Status: AC | PRN
Start: 2024-01-22 — End: 2024-01-23

## 2024-01-22 MED ORDER — FENTANYL CITRATE (PF) 100 MCG/2ML IJ SOLN
1002 | INTRAMUSCULAR | Status: DC | PRN
Start: 2024-01-22 — End: 2024-01-21
  Administered 2024-01-22: 04:00:00 25 ug via INTRAVENOUS

## 2024-01-22 MED ORDER — FENTANYL CITRATE (PF) 100 MCG/2ML IJ SOLN
1002 | Freq: Once | INTRAMUSCULAR | Status: DC | PRN
Start: 2024-01-22 — End: 2024-01-21
  Administered 2024-01-22: 01:00:00 20 via INTRASPINAL

## 2024-01-22 MED ORDER — MORPHINE SULFATE (PF) 0.5 MG/ML IJ SOLN
0.5 | INTRAMUSCULAR | Status: AC
Start: 2024-01-22 — End: ?

## 2024-01-22 MED ORDER — FAMOTIDINE (PF) 20 MG/2ML IV SOLN
202 | Freq: Two times a day (BID) | INTRAVENOUS | Status: DC
Start: 2024-01-22 — End: 2024-01-21
  Administered 2024-01-22: 01:00:00 20 mg via INTRAVENOUS

## 2024-01-22 MED ORDER — FENTANYL 0.05 MG/ML SOLN (MIXTURES ONLY)
0.05 | Freq: Once | Status: DC | PRN
Start: 2024-01-22 — End: 2024-01-21
  Administered 2024-01-22 (×2): 50 via INTRAVENOUS
  Administered 2024-01-22: 02:00:00 30 via INTRAVENOUS
  Administered 2024-01-22: 02:00:00 50 via INTRAVENOUS

## 2024-01-22 MED ORDER — MISOPROSTOL 200 MCG PO TABS
200 | ORAL | Status: DC | PRN
Start: 2024-01-22 — End: 2024-01-23

## 2024-01-22 MED ORDER — ACETAMINOPHEN 500 MG PO TABS
500 MG | Freq: Three times a day (TID) | ORAL | Status: AC
Start: 2024-01-22 — End: 2024-01-23
  Administered 2024-01-22 – 2024-01-23 (×5): 1000 mg via ORAL

## 2024-01-22 MED ORDER — SOD CITRATE-CITRIC ACID 500-334 MG/5ML PO SOLN
500-3345 | Freq: Once | ORAL | Status: AC
Start: 2024-01-22 — End: 2024-01-21
  Administered 2024-01-22: 01:00:00 30 mL via ORAL

## 2024-01-22 MED FILL — SOD CITRATE-CITRIC ACID 500-334 MG/5ML PO SOLN: 500-3345 MG/5ML | ORAL | Qty: 30

## 2024-01-22 MED FILL — KETOROLAC TROMETHAMINE 30 MG/ML IJ SOLN: 30 MG/ML | INTRAMUSCULAR | Qty: 1

## 2024-01-22 MED FILL — OXYCODONE HCL 5 MG PO TABS: 5 MG | ORAL | Qty: 1

## 2024-01-22 MED FILL — ACETAMINOPHEN EXTRA STRENGTH 500 MG PO TABS: 500 MG | ORAL | Qty: 2

## 2024-01-22 MED FILL — MORPHINE SULFATE (PF) 0.5 MG/ML IJ SOLN: 0.5 MG/ML | INTRAMUSCULAR | Qty: 10

## 2024-01-22 MED FILL — FENTANYL CITRATE (PF) 100 MCG/2ML IJ SOLN: 1002 MCG/2ML | INTRAMUSCULAR | Qty: 2

## 2024-01-22 MED FILL — FAMOTIDINE (PF) 20 MG/2ML IV SOLN: 202 MG/2ML | INTRAVENOUS | Qty: 2

## 2024-01-22 MED FILL — RHOPHYLAC 1500 UNIT/2ML IJ SOSY: 15002 UNIT/2ML | INTRAMUSCULAR | Qty: 300

## 2024-01-22 MED FILL — DIPHENHYDRAMINE HCL 25 MG PO CAPS: 25 MG | ORAL | Qty: 1

## 2024-01-22 MED FILL — ACETAMINOPHEN 10 MG/ML IV SOLN: 10 MG/ML | INTRAVENOUS | Qty: 100

## 2024-01-22 MED FILL — MEDELA TENDER CARE LANOLIN EX CREA: CUTANEOUS | Qty: 7

## 2024-01-22 MED FILL — DOCUSATE SODIUM 100 MG PO CAPS: 100 MG | ORAL | Qty: 1

## 2024-01-22 NOTE — Lactation Note (Signed)
This note was copied from a baby's chart.  Lactation visit    Mother is a 28 y.o. G1P1 c-section delivery at [redacted] weeks gestation. Mother has no significant medical or surgical history that could impede breastfeeding.    Infant's course is complicated by LGA. Infant will be on blood sugar evaluation. Mother aware as long as levels are within normal limits mother can continue feeding. Mother aware to offer breast every 3 hours with blood sugar checks.     On arrival, mother has infant in cradle position on the R breast and a shallow latch is observed. Mother's nipple has a creased appearance. Educated mother on signs and symptoms of a shallow latch and the importance of a deep latch. Infant placed in FB hold on the same breast. Educated mother on positioning, hand placement, head control, how to support infant, and hold breast. A deep latch is achieved and infant is rhythmically sucking at the breast. Mother denies pain. Educated mother on techniques to stimulate infant to extend duration of feed, hunger cues, and signs of fullness. Mother receptive to teaching. Infant breastfeeds for 20 minutes and is still breastfeeding on LC's departure.    Spoke with mother about feeding tendencies of an infant less than 24 hours of life and void and stool expectations. Spoke with mother about colostrum benefits, infant stomach size, and offering the breast every 3-4 hours or on demand. Mother aware attempts count. Mother receptive to teaching and aware to call lactation for assistance if desired.

## 2024-01-22 NOTE — Discharge Instructions (Signed)
Discharge Instructions  Right after birth and for the first few days, your vaginal flow will be bright red, similar to your menstrual flow.  After the first few days, your discharge will gradually change to a pale pink or brownish color.  By the end of the second week, you may have little more than a clear discharge.  Vaginal discharge can last up to six weeks.  It is important to change your pad on a regular basis.    Activity:  No lifting anything heavier than your infant for the first two to four weeks.      Rest frequently.  No vigorous exercise until approved by your [facility] doctor.     Gradually increase your daily activities until you are back to your normal routine.    Hygiene:  Shower only.  No baths for six weeks.       You may use soap on your incision and perineum.  Perineal sutures will       dissolve on their own.         If you had a C-Section, you may have staples that your doctor will remove in      the office.  Steri strips will fall off in about 7 - 10 days.      You should check your incision daily for signs of infection, including redness,      Drainage or swelling.  Always wipe from front to back after going to the toilet       and clean your perineum with warm tap water using the peri-bottle every time      you use the bathroom.  No tampons, douching, sexual intercourse - nothing in       your vagina - for the first six weeks after discharge.    Breast Care:  Wear a well fitting, supportive bra without underwire may be more              comfortable as your body adjusts to breast changes.             Wear breast pads - cotton is best - until your breasts stop leaking.  Change              the pads frequently.              If bottle feeding and your breasts begin filling, use cabbage leaves.               Instructions for use of cabbage are included in your                Admission/Discharge/Home folder.  You may use ice packs made of             Crushed ice in plastic bags that will mold to your  breasts.      Diet:    Drink plenty of juices and water.  Eat foods high in fiber such as bran, broccili,   cauliflower and fruits to help keep bowel movements regular.   Eat well balanced meals that include all five food groups:  Protein, vegetables,   fruits, grains and dairy.    Hormonal Changes:  Emotional changes and mood swings are not uncommon after               giving birth.  If this goes on for more than a two week period, it may              be  post partum depression.  Symptoms can include trouble              Sleeping, sleeping too much, feelings of irritability, anxiety, panic,               Hopelessness or guilt, lack of energy or motivation, poor                         concentration, and/or rapid mood swings.    Symptoms to Report to your Doctor:  Heavy Bleeding: blood is bright red and soaks a       sanitary pad in one hour or less.       Foul smelling vaginal discharge.       Difficult, painful or too-frequent urination.       Pain that becomes worse.       Mood swings or crying that feels out of control       Flu like symptoms       Temperature greater than 100.4       Incision is red, swollen or has any drainage       Trouble breathing, dizziness or faintness       Warmth, pain or redness in the lower leg     These are general instructions for a healthy lifestyle:    No smoking/ No tobacco products/ Avoid exposure to second hand smoke    Surgeon General's Warning:  Quitting smoking now greatly reduces serious risk to your health.    Obesity, smoking, and sedentary lifestyle greatly increases your risk for illness    A healthy diet, regular physical exercise & weight monitoring are important for maintaining a healthy lifestyle    You may be retaining fluid if you have a history of heart failure or if you experience any of the following symptoms:  Weight gain of 3 pounds or more overnight or 5 pounds in a week, increased swelling in our hands or feet or shortness of breath while lying flat in bed.   Please call your doctor as soon as you notice any of these symptoms; do not wait until your next office visit.    Recognize signs and symptoms of STROKE:    F-face looks uneven    A-arms unable to move or move unevenly    S-speech slurred or non-existent    T-time-call 911 as soon as signs and symptoms begin-DO NOT go       Back to bed or wait to see if you get better-TIME IS BRAIN.    Warning Signs of HEART ATTACK     Call 911 if you have these symptoms:  Chest discomfort. Most heart attacks involve discomfort in the center of the chest that lasts more than a few minutes, or that goes away and comes back. It can feel like uncomfortable pressure, squeezing, fullness, or pain.  Discomfort in other areas of the upper body. Symptoms can include pain or discomfort in one or both arms, the back, neck, jaw, or stomach.  Shortness of breath with or without chest discomfort.  Other signs may include breaking out in a cold sweat, nausea, or lightheadedness.  Don't wait more than five minutes to call 911 - MINUTES MATTER! Fast action can save your life. Calling 911 is almost always the fastest way to get lifesaving treatment. Emergency Medical Services staff can begin treatment when they arrive -- up to an hour sooner than if someone gets to the hospital by car.  The discharge information has been reviewed with Christine Graves   The {Aluel Nazareno verbalized understanding.    Discharge medications reviewed with the patient and appropriate educational materials and side effects teaching were provided.         Cesarean Section: What to Expect at Home  Your Recovery     A cesarean section, or C-section, is surgery to deliver your baby through a cut that the doctor makes in your lower belly and uterus. The cut is called an incision.  You may have some pain in your lower belly and need pain medicine for 1 to 2 weeks. You can expect some vaginal bleeding for several weeks. You will probably need about 6 weeks to fully  recover.  It's important to take it easy while the incision heals. Avoid heavy lifting, strenuous activities, and exercises that strain the belly muscles while you recover. Ask a family member or friend for help with housework, cooking, and shopping.  This care sheet gives you a general idea about how long it will take for you to recover. But each person recovers at a different pace. Follow the steps below to get better as quickly as possible.  How can you care for yourself at home?  Activity    Rest when you feel tired. Getting enough sleep will help you recover.     Try to walk each day. Start by walking a little more than you did the day before. Bit by bit, increase the amount you walk. Walking boosts blood flow and helps prevent pneumonia, constipation, and blood clots.     Avoid strenuous activities, such as bicycle riding, jogging, weightlifting, and aerobic exercise, for 6 weeks or until your doctor says it is okay.     Until your doctor says it is okay, do not lift anything heavier than your baby.     Do not do sit-ups or other exercises that strain the belly muscles for 6 weeks or until your doctor says it is okay.     Hold a pillow over your incision when you cough or take deep breaths. This will support your belly and decrease your pain.     You may shower as usual. Pat the incision dry when you are done.     You will have some vaginal bleeding. Wear sanitary pads. Do not douche or use tampons until your doctor says it is okay.     Ask your doctor when you can drive again.     You will probably need to take at least 6 weeks off work. It depends on the type of work you do and how you feel.     Ask your doctor when it is okay for you to have sex.   Diet    You can eat your normal diet. If your stomach is upset, try bland, low-fat foods like plain rice, broiled chicken, toast, and yogurt.     Drink plenty of fluids (unless your doctor tells you not to).     You may notice that your bowel movements are not  regular right after your surgery. This is common. Try to avoid constipation and straining with bowel movements. You may want to take a fiber supplement every day. If you have not had a bowel movement after a couple of days, ask your doctor about taking a mild laxative.     If you are breastfeeding, limit alcohol. Alcohol can cause a lack of energy and other health problems for the baby when  a breastfeeding woman drinks heavily. It can also get in the way of a mom's ability to feed her baby or to care for the child in other ways. There isn't a lot of research about exactly how much alcohol can harm a baby. Having no alcohol is the safest choice for your baby. If you choose to have a drink now and then, have only one drink, and limit the number of occasions that you have a drink. Wait to breastfeed at least 2 hours after you have a drink to reduce the amount of alcohol the baby may get in the milk.   Medicines    Your doctor will tell you if and when you can restart your medicines. You will also get instructions about taking any new medicines.     If you stopped taking aspirin or some other blood thinner, your doctor will tell you when to start taking it again.     Take pain medicines exactly as directed.  If the doctor gave you a prescription medicine for pain, take it as prescribed.  If you are not taking a prescription pain medicine, ask your doctor if you can take an over-the-counter medicine.     If you think your pain medicine is making you sick to your stomach:  Take your medicine after meals (unless your doctor has told you not to).  Ask your doctor for a different pain medicine.     If your doctor prescribed antibiotics, take them as directed. Do not stop taking them just because you feel better. You need to take the full course of antibiotics.   Incision care    If you have strips of tape on the incision, leave the tape on for a week or until it falls off.     Wash the area daily with warm, soapy water, and  pat it dry. Don't use hydrogen peroxide or alcohol, which can slow healing. You may cover the area with a gauze bandage if it weeps or rubs against clothing. Change the bandage every day.     Keep the area clean and dry.   Other instructions    If you breastfeed your baby, you may be more comfortable while you are healing if you don't rest your baby on your belly. Try tucking your baby under your arm, with your baby's body along the side you will be feeding on. Support your baby's upper body with your arm. With that hand you can control your baby's head to bring your baby's mouth to your breast. This is sometimes called the football hold.   Follow-up care is a key part of your treatment and safety. Be sure to make and go to all appointments, and call your doctor if you are having problems. It's also a good idea to know your test results and keep a list of the medicines you take.  When should you call for help?  Share this information with your partner, family, or a friend. They can help you watch for warning signs.  Call 911  anytime you think you may need emergency care. For example, call if:    You feel you cannot stop from hurting yourself, your baby, or someone else.     You passed out (lost consciousness).     You have chest pain, are short of breath, or cough up blood.     You have a seizure.   Where to get help 24 hours a day, 7 days a week   If  you or someone you know talks about suicide, self-harm, a mental health crisis, a substance use crisis, or any other kind of emotional distress, get help right away. You can:    Call the Suicide and Crisis Lifeline at 59.     Call 1-800-273-TALK (984-379-3357).     Text HOME to (785) 583-0065 to access the Crisis Text Line.   Consider saving these numbers in your phone.  Go to 988lifeline.org for more information or to chat online.  Call your doctor or midwife now or seek immediate medical care if:    You have loose stitches, or your incision comes open.     You have signs  of hemorrhage (too much bleeding), such as:  Heavy vaginal bleeding. This means that you are soaking through one or more pads in an hour. Or you pass blood clots bigger than an egg.  Feeling dizzy or lightheaded, or you feel like you may faint.  Feeling so tired or weak that you cannot do your usual activities.  A fast or irregular heartbeat.  New or worse belly pain.     You have symptoms of infection, such as:  Increased pain, swelling, warmth, or redness.  Red streaks leading from the incision.  Pus draining from the incision.  A fever.  Frequent or painful urination or blood in your urine.  Vaginal discharge that smells bad.  New or worse belly pain.     You have symptoms of a blood clot in your leg (called a deep vein thrombosis), such as:  Pain in the calf, back of the knee, thigh, or groin.  Swelling in the leg or groin.  A color change on the leg or groin. The skin may be reddish or purplish, depending on your usual skin color.     You have signs of preeclampsia, such as:  Sudden swelling of your face, hands, or feet.  New vision problems (such as dimness, blurring, or seeing spots).  A severe headache.     You have signs of heart failure, such as:  New or increased shortness of breath.  New or worse swelling in your legs, ankles, or feet.  Sudden weight gain, such as more than 2 to 3 pounds in a day or 5 pounds in a week.  Feeling so tired or weak that you cannot do your usual activities.     You had spinal or epidural pain relief and have:  New or worse back pain.  Increased pain, swelling, warmth, or redness at the injection site.  Tingling, weakness, or numbness in your legs or groin.   Watch closely for changes in your health, and be sure to contact your doctor or midwife if:    Your vaginal bleeding isn't decreasing.     You feel sad, anxious, or hopeless for more than a few days.     You are having problems with your breasts or breastfeeding.   Where can you learn more?  Go to  [employer] and enter M806 to learn more about "Cesarean Section: What to Expect at Home."  Current as of: June 30, 2022  Content Version: 14.1   2006-2024 [employer].   Care instructions adapted under license by [facility]. If you have questions about a medical condition or this instruction, always ask your healthcare professional. [employer] disclaims any warranty or liability for your use of this information.        Signs of an Emergency After Childbirth: Care Instructions  If you have any  of these symptoms after childbirth, it could be a sign of an emergency. Call emergency services or your doctor right away.   Call 911  if you have any of these symptoms.     Thoughts of harming yourself, your baby, or another person.  Passing out.  Chest pain.  Shortness of breath.  Coughing up blood.  A seizure.   Call the Suicide and Crisis Lifeline at 77 or 223-230-6877 if you have thoughts of suicide or self-harm.       Call your doctor or midwife now if you have any of these symptoms.     Sudden swelling of your face, hands, or feet.  New vision problems (such as dimness, blurring, or seeing spots).  A severe headache.  Heavy vaginal bleeding (soaking through one or more pads in an hour, or passing blood clots bigger than an egg).  A fast or irregular heartbeat.  New or worse belly pain.  Feeling so tired or weak that you cannot do your usual activities.  Feeling dizzy or lightheaded.  Pain in your calf, back of the knee, thigh, or groin.  Swelling in your leg or groin.  A color change on the leg or groin.  A fever.  Redness on your breast.  Vaginal discharge that smells bad.  Frequent or painful urination or blood in your urine.  Feeling sad, anxious, or hopeless for more than a few days.  Write down this information. Share it with emergency services or your doctor.      Date you gave birth:  Date and time your symptoms started:   Symptoms you are having:   Where  can you learn more?  Go to [employer] and enter E565 to learn more about "Signs of an Emergency After Childbirth: Care Instructions."  Current as of: June 30, 2022  Content Version: 14.1   2006-2024 [employer].   Care instructions adapted under license by [facility]. If you have questions about a medical condition or this instruction, always ask your healthcare professional. [employer] disclaims any warranty or liability for your use of this information.        Learning About Preeclampsia After Childbirth  What is preeclampsia?     Preeclampsia is high blood pressure and signs of organ damage, such as protein in the urine, usually after 20 weeks of pregnancy. If it's not treated, preeclampsia can harm you or your baby.  Severe preeclampsia can lead to dangerous seizures (eclampsia). When preeclampsia affects the liver, it can cause HELLP syndrome, a blood-clotting and bleeding problem. HELLP can come on quickly and can be dangerous. This is why your doctor checks you and your baby often.  Preeclampsia usually goes away after the baby is born. But symptoms may last or get worse after delivery. In rare cases, symptoms may not show up until days or even weeks after childbirth.  What are the symptoms?  Mild preeclampsia usually doesn't cause symptoms. But it may cause rapid weight gain and sudden swelling of the hands and face. Severe preeclampsia can cause symptoms such as a severe headache, vision problems, and trouble breathing. It also can cause belly pain. And you may urinate less than usual.  What can you expect after you've had preeclampsia?  In the hospital  After the baby and the placenta are delivered, preeclampsia usually starts to get better. Most people get better in the first few days after childbirth.  After having preeclampsia, you still have a risk of seizures for a day  or more after childbirth. (In very rare cases, seizures happen later on.) So  your doctor may have you take magnesium sulfate for a day or more to prevent seizures. You may also take medicine to lower your blood pressure.  When you go home  Your blood pressure will most likely return to normal a few days after delivery. Your doctor will want to check your blood pressure sometime in the first week after you leave the hospital.  High blood pressure sometimes continues after childbirth. But it usually returns to normal levels with time.  Take and record your blood pressure at home if your doctor tells you to.  Ask your doctor to check your blood pressure monitor to be sure that it is accurate and that the cuff fits you. Also ask your doctor to watch you use it, to make sure that you are using it right.  Don't eat, use tobacco products, or use medicine known to raise blood pressure (such as some nasal decongestant sprays) before you take your blood pressure.  Avoid taking your blood pressure if you have just exercised or if you're nervous or upset. Rest at least 15 minutes before you take your blood pressure.  Take your medicines exactly as prescribed. Call your doctor if you think you are having a problem with your medicine.  If you smoke, try to quit. Talk to your doctor if you need help quitting.  Eat a variety of healthy foods. Include plenty of foods high in calcium, such as dairy products, almonds, and dark leafy greens.  Long-term health  After you've had preeclampsia, you have an increased risk of high blood pressure, heart disease, stroke, and kidney disease. This may be because the same things that cause preeclampsia also cause heart and kidney disease.  To protect your health, work with your doctor on living a heart-healthy lifestyle and getting the checkups you need. Your doctor may also want you to check your blood pressure at home.  Follow-up care is a key part of your treatment and safety. Be sure to make and go to all appointments, and call your doctor if you are having problems.  It's also a good idea to know your test results and keep a list of the medicines you take.  When should you call for help?  Share this information with your partner or a friend. They can help you watch for warning signs.  Call 911  anytime you think you may need emergency care. For example, call if:    You passed out (lost consciousness).     You have a seizure.     You have trouble breathing.     You have chest pain.   Call your doctor now or seek immediate medical care if:    You have symptoms of preeclampsia, such as:  Sudden swelling of your face, hands, or feet.  New vision problems (such as dimness, blurring, or seeing spots).  A severe headache.     Your blood pressure is very high, such as 160/110 or higher.     Your blood pressure is higher than your doctor told you it should be, or it rises quickly.     You have new nausea or vomiting.     You have pain in your belly or pelvis.     You gain weight rapidly.   Where can you learn more?  Go to [employer] and enter Q718 to learn more about "Learning About Preeclampsia After Childbirth."  Current as of:  June 30, 2022  Content Version: 14.1   2006-2024 [employer].   Care instructions adapted under license by [facility]. If you have questions about a medical condition or this instruction, always ask your healthcare professional. [employer] disclaims any warranty or liability for your use of this information.        Postpartum Hemorrhage: Care Instructions  Severe bleeding from childbirth, or bleeding that causes symptoms of too much blood loss, is called a postpartum hemorrhage. Signs include having heavy bleeding and a racing heartbeat and feeling dizzy, weak, or tired. It's an emergency. It's often caused by the uterus not tightening enough after birth. It may also be caused by injuries from birth. In some cases, it's caused by a piece of the placenta that stays in the uterus after birth.  Caring for  yourself afterward    Get plenty of rest.       Take medicines as prescribed.       Talk to your doctor about whether you need to take iron pills or a multivitamin.       Wear compression stockings if your doctor recommends them.       Eat foods that are high in iron and vitamin C.  Good sources of iron include red meat, beans, leafy green vegetables, and iron-fortified breakfast cereals. For vitamin C, try citrus fruits.     Watch your bleeding closely.  You should see less of it over the next 6 weeks.     Use pads for bleeding.  Don't use tampons or cups until your doctor says it's okay.   When should you call for help?   Call 911  anytime you think you may need emergency care. For example, call if:    You passed out (lost consciousness).     You have chest pain, are short of breath, or cough up blood.   Call your doctor now or seek immediate medical care if:    You have signs of hemorrhage (too much bleeding), such as:  Heavy vaginal bleeding. This means that you are soaking through one or more pads in an hour. Or you pass blood clots bigger than an egg.  Feeling dizzy or lightheaded, or you feel like you may faint.  Feeling so tired or weak that you cannot do your usual activities.  A fast or irregular heartbeat.  New or worse belly pain.     You have symptoms of a blood clot in your leg, such as:  Pain in the calf, back of the knee, thigh, or groin.  Swelling in the leg or groin.  A color change on the leg or groin. The skin may be reddish or purplish, depending on your usual skin color.     You have signs of infection, such as:  A fever.  New or worse pain in your belly.  Vaginal discharge that smells bad.   Watch closely for changes in your health, and be sure to contact your doctor if you:    Have vaginal bleeding that's not decreasing.   Follow-up care is a key part of your treatment and safety. Be sure to make and go to all appointments, and call your doctor if you are having problems. It's also a good  idea to know your test results and keep a list of the medicines you take.  Where can you learn more?  Go to [employer] and enter P331 to learn more about "Postpartum Hemorrhage: Care Instructions."  Current as of:  June 30, 2022  Content Version: 14.1   2006-2024 [employer].   Care instructions adapted under license by [facility]. If you have questions about a medical condition or this instruction, always ask your healthcare professional. [employer] disclaims any warranty or liability for your use of this information.        Depression After Childbirth: Care Instructions  It's common to lose sleep, feel irritable, and cry easily during the first few days after childbirth. Hormone changes and the demands of a new baby can cause these "baby blues." If these mood changes last more than 2 weeks, you may have postpartum depression. This is a medical condition that requires treatment.  If you have any of these signs, you may be depressed. See your doctor right away.    You feel very sad or hopeless and lose interest in daily activities.       You sleep too much or not enough.       You feel tired or as if you have no energy.       You eat too much or too little.       You write or talk about death.     Tips to help with postpartum depression        What you can do   Try to go to all of your counseling sessions.  Take medicines as directed.  Eat healthy foods.  Get daily exercise, such as walks.  Try to get some sunlight every day.  Avoid using alcohol or other substances.  Get as much rest as possible.  Connect with friends, and join a support group for new parents.  When should you call for help?   Call 911 if:    You feel you cannot stop from hurting yourself, your baby, or someone else.   Where to get help 24 hours a day, 7 days a week   If you or someone you know talks about suicide, self-harm, a mental health crisis, a substance use crisis, or any other kind of  emotional distress, get help right away. You can:    Call the Suicide and Crisis Lifeline at 15.     Call 1-800-273-TALK (5623639413).     Text HOME to 308-868-0496 to access the Crisis Text Line.   Consider saving these numbers in your phone.  Go to 988lifeline.org for more information or to chat online.  Call your doctor now or seek immediate medical care if:    You are having trouble caring for yourself or your baby.     You hear voices.   Contact your doctor if:    You have problems with your medicines.     You do not get better as expected.   Follow-up care is a key part of your treatment and safety. Be sure to make and go to all appointments, and call your doctor if you are having problems. It's also a good idea to know your test results and keep a list of the medicines you take.  Where can you learn more?  Go to [employer] and enter Y782 to learn more about "Depression After Childbirth: Care Instructions."  Current as of: June 14, 2022  Content Version: 14.1   2006-2024 [employer].   Care instructions adapted under license by [facility]. If you have questions about a medical condition or this instruction, always ask your healthcare professional. [employer] disclaims any warranty or liability for your use of this information.

## 2024-01-22 NOTE — Lactation Note (Signed)
This note was copied from a baby's chart.  Lactation visit     Mother is attempting on the L breast in FB hold. Infant is sleepy to latch but mother reports infant fed on the R breast prior to this feed and did well. Mother instructed on hold and positioning and does well on her own. Mother aware of importance of a deep latch each feeding. Spoke with mother about feeding tendencies the first 24 hours of life. Mother aware to offer the breast every 3-4 hours or on demand. Mother receptive to teaching and aware to call for lactation assistance if desired.

## 2024-01-22 NOTE — Progress Notes (Signed)
OB Progress Note: Cesarean Delivery POD #1    Patient: Christine Graves    MRN: [id number]    Date of Birth: 1996-12-16    Age: 28 y.o.    Admit Date: 01/21/2024    Attending Physician: Babette Relic, MD    Subjective: No complaints    Current Medications:   Current Facility-Administered Medications   Medication Dose Route Frequency    sodium chloride flush 0.9 % injection 5-40 mL  5-40 mL IntraVENous 2 times per day    sodium chloride flush 0.9 % injection 5-40 mL  5-40 mL IntraVENous PRN    0.9 % sodium chloride infusion   IntraVENous PRN    acetaminophen (TYLENOL) tablet 1,000 mg  1,000 mg Oral 3 times per day    ketorolac (TORADOL) injection 30 mg  30 mg IntraVENous Q6H    Followed by    Melene Muller ON 01/23/2024] ibuprofen (ADVIL;MOTRIN) tablet 800 mg  800 mg Oral Q8H    ondansetron (ZOFRAN-ODT) disintegrating tablet 4 mg  4 mg Oral Q8H PRN    Or    ondansetron (ZOFRAN) injection 4 mg  4 mg IntraVENous Q6H PRN    docusate sodium (COLACE) capsule 100 mg  100 mg Oral BID    Rho(D) immune globulin (HYPERRHO;RHOGAM;RHOPHYLAC) injection 300 mcg  300 mcg IntraMUSCular Once PRN    lansinoh lanolin ointment   Topical Q1H PRN    measles, mumps & rubella vaccine (MMR) injection 0.5 mL  0.5 mL SubCUTAneous Prior to discharge    tetanus-diphth-acell pertussis (BOOSTRIX) injection 0.5 mL  0.5 mL IntraMUSCular Prior to discharge    tranexamic acid (CYKLOKAPRON) 1,000 mg in sodium chloride 0.9 % 110 mL IVPB (mini-bag)  1,000 mg IntraVENous Once PRN    miSOPROStol (CYTOTEC) tablet 400 mcg  400 mcg Buccal PRN    miSOPROStol (CYTOTEC) tablet 800 mcg  800 mcg Rectal PRN    carboprost (HEMABATE) injection 250 mcg  250 mcg IntraMUSCular PRN    oxyCODONE (ROXICODONE) immediate release tablet 5 mg  5 mg Oral Q4H PRN    oxytocin (PITOCIN) 30 units in 500 mL infusion  87.3 milli-units/min IntraVENous Continuous PRN       Allergies:   Allergies   Allergen Reactions    Amoxicillin Rash       Objective:    Vitals:    01/21/24 2320 01/21/24 2344  01/22/24 0451 01/22/24 0633   BP: 131/75 (!) 141/81 123/74 113/65   Pulse: 74 69 67 65   Resp: 20 16 18 18    Temp:  98.2 F (36.8 C) 98.4 F (36.9 C) 98 F (36.7 C)   TempSrc:  Oral Oral Oral   SpO2:  97% 96% 96%   Weight:       Height:                               Fundus firm 2 fingerbreaths down, nontender, incision intact without                        erythema or induration                        Lochia mild             Extremities: calves nontender       Lab:  H&H:   Recent Labs     01/21/24  1641 01/22/24  0739  HGB 14.2 12.1   HCT 42.7 37.1      CBC:  @BRIEFLAB24 (wbc,hgb,hct,plt)@  Lab Results   Component Value Date/Time    NA 135 01/21/2024 04:41 PM    K 3.7 01/21/2024 04:41 PM    CL 105 01/21/2024 04:41 PM    CO2 20 01/21/2024 04:41 PM    BUN 7 01/21/2024 04:41 PM    GFRAA >60.0 01/21/2024 04:41 PM     Recent Glucose Results:                                        @BRIEFLAB24 (glpoc:3,glu:3,glufpoct:3,glucosepoc:3,glupoc:3,GLUCPOC:3)@                   Impression: Postoperative Day #1, doing well.    Plan: Routine postpartum care    Riki Sheer, MD  January 22, 2024  10:01 AM

## 2024-01-23 MED ORDER — DIPHENHYDRAMINE HCL 25 MG PO CAPS
25 | ORAL_CAPSULE | Freq: Four times a day (QID) | ORAL | 0 refills | Status: AC | PRN
Start: 2024-01-23 — End: 2024-02-02

## 2024-01-23 MED ORDER — IBUPROFEN 400 MG PO TABS
400 | Freq: Three times a day (TID) | ORAL | Status: DC
Start: 2024-01-23 — End: 2024-01-23
  Administered 2024-01-23: 17:00:00 800 mg via ORAL

## 2024-01-23 MED ORDER — OXYCODONE-ACETAMINOPHEN 5-325 MG PO TABS
5-325 | ORAL_TABLET | Freq: Four times a day (QID) | ORAL | 0 refills | Status: AC | PRN
Start: 2024-01-23 — End: 2024-02-06

## 2024-01-23 MED ORDER — IBUPROFEN 600 MG PO TABS
600 | ORAL_TABLET | Freq: Four times a day (QID) | ORAL | 0 refills | Status: AC | PRN
Start: 2024-01-23 — End: ?

## 2024-01-23 MED FILL — SODIUM CHLORIDE FLUSH 0.9 % IV SOLN: 0.9 % | INTRAVENOUS | Qty: 10

## 2024-01-23 MED FILL — IBUPROFEN 400 MG PO TABS: 400 MG | ORAL | Qty: 2

## 2024-01-23 MED FILL — ACETAMINOPHEN EXTRA STRENGTH 500 MG PO TABS: 500 MG | ORAL | Qty: 2

## 2024-01-23 MED FILL — DOCUSATE SODIUM 100 MG PO CAPS: 100 MG | ORAL | Qty: 1

## 2024-01-23 MED FILL — OXYCODONE HCL 5 MG PO TABS: 5 MG | ORAL | Qty: 1

## 2024-01-23 MED FILL — BOOSTRIX 5-2.5-18.5 LF-MCG/0.5 IM SUSY: 5-2.5-18.5-0.5 LF-MCG/0.5 | INTRAMUSCULAR | Qty: 0.5

## 2024-01-23 MED FILL — DIPHENHYDRAMINE HCL 25 MG PO CAPS: 25 MG | ORAL | Qty: 1

## 2024-01-23 NOTE — Telephone Encounter (Addendum)
 ----------DocumentID: [id number] (AP)-------------------------------------------              [facility]                       Patient Education Report         Name: Christine, Graves                  Date: 01/22/2024    MRN: [id number]                    Time: 12:30:39 AM         Patient ordered survey: Admission Questionnaire (New Mom)    from [phone] via phone number: [phone] at 12:30:39 AM    <p>Soon after birth, we begin planning for you and your baby to go home. This process is called discharge planning. To ensure your transition home is smooth, please answer a few questions about how you feel today. We will ask you again just before you leave to make sure you feel ready to leave the hospital.</p>    Question 1: How physically ready are you to go home?         Answer 1: Not ready at all (Selected Answer)         Answer 2: Slightly ready         Answer 3: Somewhat ready         Answer 4: Moderately ready         Answer 5: Totally ready  Question 2: How would you describe your energy today?         Answer 1: Low energy         Answer 2: Little energy         Answer 3: Some energy (Selected Answer)         Answer 4: Moderate energy         Answer 5: High energy  Question 3: How much do you know about problems to watch for after you go home?         Answer 1: Know nothing at all (Selected Answer)         Answer 2: Know a little         Answer 3: Know some things         Answer 4: Know a moderate amount         Answer 5: Know all  Question 4: How much do you know about restrictions (what you are allowed and not allowed to do) after you go home?         Answer 1: Know nothing at all (Selected Answer)         Answer 2: Know a little         Answer 3: Know some things         Answer 4: Know a moderate amount         Answer 5: Know all  Question 5: How well will you be able to handle the demands of life at home?         Answer 1: Not at all         Answer 2: A little         Answer 3: Some  (Selected Answer)         Answer 4: Moderately         Answer 5: Extremely well  Question 6: How well will you be  able to perform your personal care (for example, hygiene, bathing, toileting, eating) at home?         Answer 1: Not at all         Answer 2: A little         Answer 3: Some (Selected Answer)         Answer 4: Moderately         Answer 5: Extremely well  Question 7: How much help will you have if needed with your personal care after you go home?         Answer 1: None         Answer 2: A little         Answer 3: Some         Answer 4: A moderate amount         Answer 5: A great deal (Selected Answer)  Question 8: How much help will you have if needed with your medical care needs (treatments, medications) after you go home?         Answer 1: None         Answer 2: A little         Answer 3: Some         Answer 4: A moderate amount         Answer 5: A great deal (Selected Answer)  Question 9: Which best describes your experience with motherhood?         Answer 1: Thisis my first baby. (Selected Answer)         Answer 2: I have other biological children.         Answer 3: I have adopted children.         Answer 4: I have had a pregnancy loss or stillbirth.         Answer 5: I have a combination of the above.  ----------DocumentID: [id number] (AP)-------------------------------------------              [facility]                       Patient Education Report         Name: Christine, Graves                  Date: 01/22/2024    MRN: [id number]                    Time: 12:35:24 AM         Patient ordered video: 'Safe Sleep Practices'    from [id number] via phone number: [phone] at 12:35:24 AM    Description: Safe Sleep Practices    ----------DocumentID: [id number] (AP)-------------------------------------------              [facility]                       Patient Education Report         Name: Christine, Graves                  Date: 01/23/2024    MRN: [id number]                     Time: 12:16:58 PM         Patient ordered video: 'Baby Discharge at the [facility]'    from [id number] via phone number: [phone] at 12:16:58 PM  Description: This video prepares you for leaving the hospital and caring for your baby safely at home.    ----------DocumentID: [id number] (AP)-------------------------------------------              [facility]                       Patient Education Report         Name: Christine, Graves                  Date: 01/23/2024    MRN: [id number]                    Time: 12:20:45 PM         Patient ordered quiz: --Knowledge Check (Baby)    from [id number] via phone number: [phone] at 12:20:45 PM    <p>There is much to learn with a new baby. We want to ask some questions to see what you remember from the video. Your answers will help [place] know if we need to explain anything further. It will also help you recall what is most important.</p>    Score: 100%  Question 1: Formula or breastmilk is all your baby needs for at least 6 months.         Answer 1: True (Correct Answer) (Selected Answer)         Answer 2: False  Question 2: How do I know if my baby is getting enough to eat in the first few days?         Answer 1: 1 wet diaper per day         Answer 2: 3-6 wet diapers per day (Correct Answer) (Selected Answer)         Answer 3: More than 6 wet diapers per day  Question 3: Babies are safest sleeping alone on their back, in their cribs with no blankets, bumpers, pillows or stuffed animals.         Answer 1: True (Correct Answer) (Selected Answer)         Answer 2: False  Question 4: An infant car seat base should be tight to the seat, straps are snug with no extra space, and is rear facing.         Answer 1: True (Correct Answer) (Selected Answer)         Answer 2: False  Question 5: There are several ways you can calm your baby to prevent Shaken Baby Syndrome. What tip below do you think you should try first?         Answer 1: Take my baby for a car ride.          Answer 2: Check for a dirty or wet diaper. (Correct Answer) (Selected Answer)         Answer 3: Ask a trusted person for help.         Answer 4: Check to see if baby is hungry or in pain.         Answer 5: Let the baby cry alone for a few minutes.  Question 6: When should I call for help?         Answer 1: Rectal Temp of 100.4 degrees or higher         Answer 2: Unusual or excessive crying         Answer 3: Umbilical cord redness or drainage         Answer 4:  Vomiting         Answer 5: All of the above (Correct Answer) (Selected Answer)  Question 7: A follow up appointment should be made with the baby's doctor BEFORE you leave the hospital.         Answer 1: True (Correct Answer) (Selected Answer)         Answer 2: False  ----------DocumentID: [id number] (AP)-------------------------------------------                       [facility]          Patient Education Report - Discharge Summary        Date: 01/23/2024   Time: 4:20:25 PM   Name: Christine, Graves   MRN: [id number]      Account Number: [id number]      Education History:        Patient ordered survey: 'Admission Questionnaire (New Mom)' from [id number] on 01/22/2024 12:30:39 AM    Patient ordered video: 'Safe Sleep Practices' from [id number] on 01/22/2024 12:35:24 AM    Patient ordered video: 'Baby Discharge at the [facility]' from [email or web address] on 01/23/2024 12:16:58 PM    Patient ordered quiz: '--Knowledge Check (Baby)' from [id number] on 01/23/2024 12:20:45 PM  Score: 100%

## 2024-01-23 NOTE — Progress Notes (Signed)
OB Progress Note: Cesarean Delivery POD #2    Patient: Christine Graves    MRN: [id number]    Date of Birth: 1996/04/23    Age: 28 y.o.    Admit Date: 01/21/2024   Attending Physician:Ferebee, Marylene Land, MD    Subjective: No complaints. Tolerating regular diet.  Patient desires to be discharged home today.    Current Medications:   Current Facility-Administered Medications   Medication Dose Route Frequency    ibuprofen (ADVIL;MOTRIN) tablet 800 mg  800 mg Oral Q8H    sodium chloride flush 0.9 % injection 5-40 mL  5-40 mL IntraVENous 2 times per day    sodium chloride flush 0.9 % injection 5-40 mL  5-40 mL IntraVENous PRN    0.9 % sodium chloride infusion   IntraVENous PRN    acetaminophen (TYLENOL) tablet 1,000 mg  1,000 mg Oral 3 times per day    ondansetron (ZOFRAN-ODT) disintegrating tablet 4 mg  4 mg Oral Q8H PRN    Or    ondansetron (ZOFRAN) injection 4 mg  4 mg IntraVENous Q6H PRN    docusate sodium (COLACE) capsule 100 mg  100 mg Oral BID    lansinoh lanolin ointment   Topical Q1H PRN    measles, mumps & rubella vaccine (MMR) injection 0.5 mL  0.5 mL SubCUTAneous Prior to discharge    miSOPROStol (CYTOTEC) tablet 400 mcg  400 mcg Buccal PRN    miSOPROStol (CYTOTEC) tablet 800 mcg  800 mcg Rectal PRN    carboprost (HEMABATE) injection 250 mcg  250 mcg IntraMUSCular PRN    oxyCODONE (ROXICODONE) immediate release tablet 5 mg  5 mg Oral Q4H PRN    diphenhydrAMINE (BENADRYL) capsule 25 mg  25 mg Oral Q6H PRN    oxytocin (PITOCIN) 30 units in 500 mL infusion  87.3 milli-units/min IntraVENous Continuous PRN       Allergies:   Allergies   Allergen Reactions    Amoxicillin Rash       Objective:    Vitals:    01/22/24 0451 01/22/24 0633 01/22/24 2305 01/23/24 0536   BP: 123/74 113/65 115/77 136/79   Pulse: 67 65 74 93   Resp: 18 18 18 18    Temp: 98.4 F (36.9 C) 98 F (36.7 C) 98.3 F (36.8 C) 97.5 F (36.4 C)   TempSrc: Oral Oral Oral Oral   SpO2: 96% 96% 97% 97%   Weight:       Height:                              Fundus  firm 2 fingerbreaths down, nontender, incision intact without                        erythema or induration                        Lochia mild             Extremities: calves nontender                        Lab:  H&H:   Recent Labs     01/21/24  1641 01/22/24  0739   HGB 14.2 12.1   HCT 42.7 37.1      CBC:  @BRIEFLAB24 (wbc,hgb,hct,plt)@  Lab Results   Component Value Date/Time    NA 135 01/21/2024 04:41 PM  K 3.7 01/21/2024 04:41 PM    CL 105 01/21/2024 04:41 PM    CO2 20 01/21/2024 04:41 PM    BUN 7 01/21/2024 04:41 PM    GFRAA >60.0 01/21/2024 04:41 PM     Recent Glucose Results:                                        @BRIEFLAB24 (glpoc:3,glu:3,glufpoct:3,glucosepoc:3,glupoc:3,GLUCPOC:3)@      Impression: Postoperative Day #2, doing well.  Desires to be discharged to home today.    Plan: Routine postpartum care.  Discharge to home    Riki Sheer, MD  January 23, 2024  1:13 PM

## 2024-01-23 NOTE — Progress Notes (Addendum)
1340- All discharge teaching completed on postpartum care.  Patient verbalized understanding. Waiting in room for husband to return with car seat.    1545- D/c to car by staff member.

## 2024-01-23 NOTE — Lactation Note (Signed)
This note was copied from a baby's chart.  Lactation note    Mom reports good latch and feeding but sore nipples. No damage noted bilateral. Encouraged mom to call for latch assistance with next feeding. Discussed feeding tendencies, colostrum,weight loss and when to except mature milk to come in.
